# Patient Record
Sex: Female | Born: 1970 | Race: Black or African American | Hispanic: No | Marital: Married | Smoking: Never smoker
Health system: Southern US, Community
[De-identification: ages and names within clinical notes are randomized; demographics above are authoritative.]

## PROBLEM LIST (undated history)

## (undated) DIAGNOSIS — F419 Anxiety disorder, unspecified: Secondary | ICD-10-CM

## (undated) DIAGNOSIS — I10 Essential (primary) hypertension: Secondary | ICD-10-CM

## (undated) DIAGNOSIS — E119 Type 2 diabetes mellitus without complications: Secondary | ICD-10-CM

## (undated) DIAGNOSIS — F32A Depression, unspecified: Secondary | ICD-10-CM

## (undated) DIAGNOSIS — G4730 Sleep apnea, unspecified: Secondary | ICD-10-CM

## (undated) HISTORY — PX: EYE SURGERY: SHX253

## (undated) HISTORY — PX: TUBAL LIGATION: SHX77

## (undated) HISTORY — DX: Sleep apnea, unspecified: G47.30

## (undated) HISTORY — PX: HERNIA REPAIR: SHX51

---

## 2005-12-30 ENCOUNTER — Emergency Department: Payer: Self-pay | Admitting: Unknown Physician Specialty

## 2006-12-17 ENCOUNTER — Ambulatory Visit: Payer: Self-pay | Admitting: *Deleted

## 2008-01-25 ENCOUNTER — Emergency Department: Payer: Self-pay | Admitting: Emergency Medicine

## 2008-11-02 ENCOUNTER — Emergency Department: Payer: Self-pay | Admitting: Emergency Medicine

## 2010-03-08 ENCOUNTER — Emergency Department: Payer: Self-pay | Admitting: Internal Medicine

## 2011-10-24 ENCOUNTER — Emergency Department: Payer: Self-pay | Admitting: *Deleted

## 2011-10-24 LAB — COMPREHENSIVE METABOLIC PANEL
Albumin: 3.7 g/dL (ref 3.4–5.0)
Anion Gap: 3 — ABNORMAL LOW (ref 7–16)
BUN: 16 mg/dL (ref 7–18)
Bilirubin,Total: 0.3 mg/dL (ref 0.2–1.0)
Chloride: 105 mmol/L (ref 98–107)
EGFR (African American): >60
Osmolality: 284 (ref 275–301)
Potassium: 3.8 mmol/L (ref 3.5–5.1)
SGOT(AST): 9 U/L — ABNORMAL LOW (ref 15–37)
Sodium: 138 mmol/L (ref 136–145)
Total Protein: 7.3 g/dL (ref 6.4–8.2)

## 2011-10-24 LAB — CBC
HCT: 36.9 % (ref 35.0–47.0)
HGB: 12 g/dL (ref 12.0–16.0)
MCH: 25.9 pg — ABNORMAL LOW (ref 26.0–34.0)
MCHC: 32.6 g/dL (ref 32.0–36.0)
MCV: 80 fL (ref 80–100)
Platelet: 282 10*3/uL (ref 150–440)
RBC: 4.64 10*6/uL (ref 3.80–5.20)
WBC: 8.4 10*3/uL (ref 3.6–11.0)

## 2011-10-24 LAB — URINALYSIS, COMPLETE
Bacteria: NONE SEEN
Blood: NEGATIVE
Glucose,UR: 50 mg/dL (ref 0–75)
Ketone: NEGATIVE
Nitrite: NEGATIVE
Ph: 5 (ref 4.5–8.0)
Protein: 100
Specific Gravity: 1.041 (ref 1.003–1.030)
Squamous Epithelial: 35

## 2011-10-24 LAB — LIPASE, BLOOD: Lipase: 109 U/L (ref 73–393)

## 2013-03-19 ENCOUNTER — Emergency Department: Payer: Self-pay | Admitting: Emergency Medicine

## 2013-03-19 LAB — CBC
HCT: 39.9 % (ref 35.0–47.0)
HGB: 13.2 g/dL (ref 12.0–16.0)
MCH: 26.2 pg (ref 26.0–34.0)
MCHC: 33.2 g/dL (ref 32.0–36.0)
MCV: 79 fL — ABNORMAL LOW (ref 80–100)
Platelet: 215 10*3/uL (ref 150–440)
RBC: 5.05 10*6/uL (ref 3.80–5.20)
RDW: 13.6 % (ref 11.5–14.5)
WBC: 10.4 10*3/uL (ref 3.6–11.0)

## 2013-03-19 LAB — URINALYSIS, COMPLETE
Bilirubin,UR: NEGATIVE
Glucose,UR: >=500 mg/dL (ref 0–75)
Ketone: NEGATIVE
Leukocyte Esterase: NEGATIVE
Nitrite: NEGATIVE
Ph: 5 (ref 4.5–8.0)
RBC,UR: 2 /HPF (ref 0–5)
SPECIFIC GRAVITY: 1.035 (ref 1.003–1.030)
Squamous Epithelial: 9
WBC UR: 4 /HPF (ref 0–5)

## 2013-03-19 LAB — WET PREP, GENITAL

## 2013-03-19 LAB — GC/CHLAMYDIA PROBE AMP

## 2013-03-19 LAB — HCG, QUANTITATIVE, PREGNANCY: Beta Hcg, Quant.: 2710 m[IU]/mL — ABNORMAL HIGH

## 2013-10-13 ENCOUNTER — Emergency Department: Payer: Self-pay | Admitting: Emergency Medicine

## 2014-03-30 ENCOUNTER — Emergency Department: Payer: Self-pay | Admitting: Emergency Medicine

## 2014-05-17 ENCOUNTER — Emergency Department: Payer: Self-pay | Admitting: Emergency Medicine

## 2014-05-17 LAB — COMPREHENSIVE METABOLIC PANEL
ALBUMIN: 3.8 g/dL
Alkaline Phosphatase: 80 U/L
Anion Gap: 6 — ABNORMAL LOW (ref 7–16)
BILIRUBIN TOTAL: 0.2 mg/dL — AB
BUN: 16 mg/dL
CREATININE: 0.69 mg/dL
Calcium, Total: 8.8 mg/dL — ABNORMAL LOW
Chloride: 98 mmol/L — ABNORMAL LOW
Co2: 30 mmol/L
EGFR (Non-African Amer.): >60
Glucose: 421 mg/dL — ABNORMAL HIGH
Potassium: 4.8 mmol/L
SGOT(AST): 15 U/L
SGPT (ALT): 11 U/L — ABNORMAL LOW
Sodium: 134 mmol/L — ABNORMAL LOW
TOTAL PROTEIN: 7.1 g/dL

## 2014-05-17 LAB — CBC
HCT: 38.1 % (ref 35.0–47.0)
HGB: 12.1 g/dL (ref 12.0–16.0)
MCH: 25 pg — AB (ref 26.0–34.0)
MCHC: 31.8 g/dL — AB (ref 32.0–36.0)
MCV: 78 fL — ABNORMAL LOW (ref 80–100)
Platelet: 244 10*3/uL (ref 150–440)
RBC: 4.86 10*6/uL (ref 3.80–5.20)
RDW: 13.3 % (ref 11.5–14.5)
WBC: 8.5 10*3/uL (ref 3.6–11.0)

## 2014-05-17 LAB — URINALYSIS, COMPLETE
Bilirubin,UR: NEGATIVE
Glucose,UR: >=500 mg/dL (ref 0–75)
Ketone: NEGATIVE
NITRITE: NEGATIVE
Ph: 5 (ref 4.5–8.0)
Protein: NEGATIVE
RBC,UR: 3 /HPF (ref 0–5)
SPECIFIC GRAVITY: 1.024 (ref 1.003–1.030)
Squamous Epithelial: 7
WBC UR: 2 /HPF (ref 0–5)

## 2015-03-21 ENCOUNTER — Encounter: Payer: Self-pay | Admitting: Urgent Care

## 2015-03-21 ENCOUNTER — Emergency Department: Payer: BLUE CROSS/BLUE SHIELD

## 2015-03-21 DIAGNOSIS — Z79899 Other long term (current) drug therapy: Secondary | ICD-10-CM | POA: Insufficient documentation

## 2015-03-21 DIAGNOSIS — R202 Paresthesia of skin: Secondary | ICD-10-CM | POA: Insufficient documentation

## 2015-03-21 DIAGNOSIS — R51 Headache: Secondary | ICD-10-CM | POA: Diagnosis not present

## 2015-03-21 DIAGNOSIS — R200 Anesthesia of skin: Secondary | ICD-10-CM | POA: Diagnosis present

## 2015-03-21 DIAGNOSIS — E119 Type 2 diabetes mellitus without complications: Secondary | ICD-10-CM | POA: Diagnosis not present

## 2015-03-21 DIAGNOSIS — I10 Essential (primary) hypertension: Secondary | ICD-10-CM | POA: Insufficient documentation

## 2015-03-21 DIAGNOSIS — Z3202 Encounter for pregnancy test, result negative: Secondary | ICD-10-CM | POA: Diagnosis not present

## 2015-03-21 DIAGNOSIS — R110 Nausea: Secondary | ICD-10-CM | POA: Diagnosis not present

## 2015-03-21 DIAGNOSIS — K429 Umbilical hernia without obstruction or gangrene: Secondary | ICD-10-CM | POA: Insufficient documentation

## 2015-03-21 LAB — CBC
HCT: 36.5 % (ref 35.0–47.0)
HEMOGLOBIN: 11.8 g/dL — AB (ref 12.0–16.0)
MCH: 24.5 pg — ABNORMAL LOW (ref 26.0–34.0)
MCHC: 32.3 g/dL (ref 32.0–36.0)
MCV: 75.9 fL — ABNORMAL LOW (ref 80.0–100.0)
Platelets: 302 10*3/uL (ref 150–440)
RBC: 4.8 MIL/uL (ref 3.80–5.20)
RDW: 14 % (ref 11.5–14.5)
WBC: 8.5 10*3/uL (ref 3.6–11.0)

## 2015-03-21 LAB — BASIC METABOLIC PANEL
ANION GAP: 9 (ref 5–15)
BUN: 18 mg/dL (ref 6–20)
CALCIUM: 9.2 mg/dL (ref 8.9–10.3)
CO2: 27 mmol/L (ref 22–32)
Chloride: 97 mmol/L — ABNORMAL LOW (ref 101–111)
Creatinine, Ser: 0.59 mg/dL (ref 0.44–1.00)
GFR calc non Af Amer: >60 mL/min (ref >60)
Glucose, Bld: 367 mg/dL — ABNORMAL HIGH (ref 65–99)
POTASSIUM: 4 mmol/L (ref 3.5–5.1)
Sodium: 133 mmol/L — ABNORMAL LOW (ref 135–145)

## 2015-03-21 LAB — TROPONIN I: TROPONIN I: 0.03 ng/mL (ref <0.031)

## 2015-03-21 NOTE — ED Notes (Signed)
Schaevitz,MD consulted. MD made aware of presenting complaints and triage assessment. MD with [facility] for cardiac protocols and head CT. Orders to be entered and carried by this RN.

## 2015-03-21 NOTE — ED Notes (Signed)
Patient presents to the ED with multiple c/o. Patient expressing that she has numbness and tingling to the LUE and LEFT side of her face that began today at noon. Of note, patient had similar symptoms last week. Patient grossly NI with no facial droop; no pronator drift. Also, c/o a known umbilical hernia that has become increasingly painful over the last few days.

## 2015-03-22 ENCOUNTER — Emergency Department: Payer: BLUE CROSS/BLUE SHIELD

## 2015-03-22 ENCOUNTER — Emergency Department
Admission: EM | Admit: 2015-03-22 | Discharge: 2015-03-22 | Disposition: A | Discharge status: Home / Self Care | Payer: BLUE CROSS/BLUE SHIELD | Attending: Emergency Medicine | Admitting: Emergency Medicine

## 2015-03-22 DIAGNOSIS — R1033 Periumbilical pain: Secondary | ICD-10-CM

## 2015-03-22 DIAGNOSIS — K429 Umbilical hernia without obstruction or gangrene: Secondary | ICD-10-CM

## 2015-03-22 DIAGNOSIS — R202 Paresthesia of skin: Secondary | ICD-10-CM

## 2015-03-22 DIAGNOSIS — R51 Headache: Secondary | ICD-10-CM

## 2015-03-22 DIAGNOSIS — R519 Headache, unspecified: Secondary | ICD-10-CM

## 2015-03-22 HISTORY — DX: Essential (primary) hypertension: I10

## 2015-03-22 HISTORY — DX: Type 2 diabetes mellitus without complications: E11.9

## 2015-03-22 LAB — URINALYSIS COMPLETE WITH MICROSCOPIC (ARMC ONLY)
BILIRUBIN URINE: NEGATIVE
Bacteria, UA: NONE SEEN
KETONES UR: NEGATIVE mg/dL
Nitrite: NEGATIVE
PH: 5 (ref 5.0–8.0)
Protein, ur: 100 mg/dL — AB
Specific Gravity, Urine: 1.037 — ABNORMAL HIGH (ref 1.005–1.030)

## 2015-03-22 LAB — GLUCOSE, CAPILLARY: GLUCOSE-CAPILLARY: 309 mg/dL — AB (ref 65–99)

## 2015-03-22 LAB — POCT PREGNANCY, URINE: Preg Test, Ur: NEGATIVE

## 2015-03-22 MED ORDER — IOHEXOL 240 MG/ML SOLN
25.0000 mL | Freq: Once | INTRAMUSCULAR | Status: AC | PRN
  Administered 2015-03-22: 25 mL via ORAL

## 2015-03-22 MED ORDER — IOHEXOL 350 MG/ML SOLN
125.0000 mL | Freq: Once | INTRAVENOUS | Status: AC | PRN
  Administered 2015-03-22: 125 mL via INTRAVENOUS

## 2015-03-22 MED ORDER — KETOROLAC TROMETHAMINE 30 MG/ML IJ SOLN
30.0000 mg | Freq: Once | INTRAMUSCULAR | Status: AC
  Administered 2015-03-22: 30 mg via INTRAVENOUS
  Filled 2015-03-22: qty 1

## 2015-03-22 MED ORDER — DIPHENHYDRAMINE HCL 50 MG/ML IJ SOLN
25.0000 mg | Freq: Once | INTRAMUSCULAR | Status: AC
  Administered 2015-03-22: 25 mg via INTRAVENOUS
  Filled 2015-03-22: qty 1

## 2015-03-22 MED ORDER — METOCLOPRAMIDE HCL 5 MG/ML IJ SOLN
10.0000 mg | Freq: Once | INTRAMUSCULAR | Status: AC
  Administered 2015-03-22: 10 mg via INTRAVENOUS
  Filled 2015-03-22: qty 2

## 2015-03-22 MED ORDER — SODIUM CHLORIDE 0.9 % IV BOLUS (SEPSIS)
1000.0000 mL | Freq: Once | INTRAVENOUS | Status: AC
  Administered 2015-03-22: 1000 mL via INTRAVENOUS

## 2015-03-22 NOTE — ED Notes (Signed)
Iv placed in right ac per pt preference.

## 2015-03-22 NOTE — Discharge Instructions (Signed)
Abdominal Pain, Adult Many things can cause abdominal pain. Usually, abdominal pain is not caused by a disease and will improve without treatment. It can often be observed and treated at home. Your health care provider will do a physical exam and possibly order blood tests and X-rays to help determine the seriousness of your pain. However, in many cases, more time must pass before a clear cause of the pain can be found. Before that point, your health care provider may not know if you need more testing or further treatment. HOME CARE INSTRUCTIONS Monitor your abdominal pain for any changes. The following actions may help to alleviate any discomfort you are experiencing:  Only take over-the-counter or prescription medicines as directed by your health care provider.  Do not take laxatives unless directed to do so by your health care provider.  Try a clear liquid diet (broth, tea, or water) as directed by your health care provider. Slowly move to a bland diet as tolerated. SEEK MEDICAL CARE IF:  You have unexplained abdominal pain.  You have abdominal pain associated with nausea or diarrhea.  You have pain when you urinate or have a bowel movement.  You experience abdominal pain that wakes you in the night.  You have abdominal pain that is worsened or improved by eating food.  You have abdominal pain that is worsened with eating fatty foods.  You have a fever. SEEK IMMEDIATE MEDICAL CARE IF:  Your pain does not go away within 2 hours.  You keep throwing up (vomiting).  Your pain is felt only in portions of the abdomen, such as the right side or the left lower portion of the abdomen.  You pass bloody or black tarry stools. MAKE SURE YOU:  Understand these instructions.  Will watch your condition.  Will get help right away if you are not doing well or get worse.   This information is not intended to replace advice given to you by your health care provider. Make sure you discuss  any questions you have with your health care provider.   Document Released: 11/14/2004 Document Revised: 10/26/2014 Document Reviewed: 10/14/2012 Elsevier Interactive Patient Education 2016 [place] Headache Without Cause A headache is pain or discomfort felt around the head or neck area. The specific cause of a headache may not be found. There are many causes and types of headaches. A few common ones are:  Tension headaches.  Migraine headaches.  Cluster headaches.  Chronic daily headaches. HOME CARE INSTRUCTIONS  Watch your condition for any changes. Take these steps to help with your condition: Managing Pain  Take over-the-counter and prescription medicines only as told by your health care provider.  Lie down in a dark, quiet room when you have a headache.  If directed, apply ice to the head and neck area:  Put ice in a plastic bag.  Place a towel between your skin and the bag.  Leave the ice on for 20 minutes, 2-3 times per day.  Use a heating pad or hot shower to apply heat to the head and neck area as told by your health care provider.  Keep lights dim if bright lights bother you or make your headaches worse. Eating and Drinking  Eat meals on a regular schedule.  Limit alcohol use.  Decrease the amount of caffeine you drink, or stop drinking caffeine. General Instructions  Keep all follow-up visits as told by your health care provider. This is important.  Keep a headache journal to help  find out what may trigger your headaches. For example, write down:  What you eat and drink.  How much sleep you get.  Any change to your diet or medicines.  Try massage or other relaxation techniques.  Limit stress.  Sit up straight, and do not tense your muscles.  Do not use tobacco products, including cigarettes, chewing tobacco, or e-cigarettes. If you need help quitting, ask your health care provider.  Exercise regularly as told by your health care  provider.  Sleep on a regular schedule. Get 7-9 hours of sleep, or the amount recommended by your health care provider. SEEK MEDICAL CARE IF:   Your symptoms are not helped by medicine.  You have a headache that is different from the usual headache.  You have nausea or you vomit.  You have a fever. SEEK IMMEDIATE MEDICAL CARE IF:   Your headache becomes severe.  You have repeated vomiting.  You have a stiff neck.  You have a loss of vision.  You have problems with speech.  You have pain in the eye or ear.  You have muscular weakness or loss of muscle control.  You lose your balance or have trouble walking.  You feel faint or pass out.  You have confusion.   This information is not intended to replace advice given to you by your health care provider. Make sure you discuss any questions you have with your health care provider.   Document Released: 02/04/2005 Document Revised: 10/26/2014 Document Reviewed: 05/30/2014 Elsevier Interactive Patient Education 2016 [employer]  Paresthesia Paresthesia is an abnormal burning or prickling sensation. This sensation is generally felt in the hands, arms, legs, or feet. However, it may occur in any part of the body. Usually, it is not painful. The feeling may be described as:  Tingling or numbness.  Pins and needles.  Skin crawling.  Buzzing.  Limbs falling asleep.  Itching. Most people experience temporary (transient) paresthesia at some time in their lives. Paresthesia may occur when you breathe too quickly (hyperventilation). It can also occur without any apparent cause. Commonly, paresthesia occurs when pressure is placed on a nerve. The sensation quickly goes away after the pressure is removed. For some people, however, paresthesia is a long-lasting (chronic) condition that is caused by an underlying disorder. If you continue to have paresthesia, you may need further medical evaluation. HOME CARE INSTRUCTIONS Watch  your condition for any changes. Taking the following actions may help to lessen any discomfort that you are feeling:  Avoid drinking alcohol.  Try acupuncture or massage to help relieve your symptoms.  Keep all follow-up visits as directed by your health care provider. This is important. SEEK MEDICAL CARE IF:  You continue to have episodes of paresthesia.  Your burning or prickling feeling gets worse when you walk.  You have pain, cramps, or dizziness.  You develop a rash. SEEK IMMEDIATE MEDICAL CARE IF:  You feel weak.  You have trouble walking or moving.  You have problems with speech, understanding, or vision.  You feel confused.  You cannot control your bladder or bowel movements.  You have numbness after an injury.  You faint.   This information is not intended to replace advice given to you by your health care provider. Make sure you discuss any questions you have with your health care provider.   Document Released: 01/25/2002 Document Revised: 06/21/2014 Document Reviewed: 01/31/2014 Elsevier Interactive Patient Education [employer].

## 2015-03-22 NOTE — ED Notes (Addendum)
Pt resting. Family with pt

## 2015-03-22 NOTE — ED Notes (Signed)
Pt waiting on ct scan.  Iv fluids infusing.  Pt sleepy.  Easily aroused.

## 2015-03-22 NOTE — ED Provider Notes (Signed)
[facility] Emergency Department Provider Note  ____________________________________________  Time seen: Approximately 1215 AM  I have reviewed the triage vital signs and the nursing notes.   HISTORY  Chief Complaint Hernia and Numbness    HPI Kathryn Hogan is a 45 y.o. female who comes into the hospital today with multiple complaints.The patient is reporting some numbness in her face and arms. She reports that the symptoms initially started last week but went away. The patient was not seen when this happened at that time. The patient reports that the numbness is under her left arm and top of her left face. She reports that the symptoms came back today around noon. Patient was cleaning when the symptoms started. She reports that the sensation fades in and seems to come back but her arm is been consistent. The patient denies any weakness. She has had some headache nausea and blurred vision with the headache she rates a 7 out of 10 in intensity. The patient did not take anything for her headache but reports this started after the numbness started. She reports also that she has an umbilical hernia and has had some sharp pain on and off all week but today the pain seems to be bothering her more than normal. The patient rates her abdominal pain and 9 out of 10 in intensity. The patient has had some constipation.   Past Medical History  Diagnosis Date  . Diabetes mellitus without complication ([place])   . Hypertension     There are no active problems to display for this patient.   History reviewed. No pertinent past surgical history.  Current Outpatient Rx  Name  Route  Sig  Dispense  Refill  . BENAZEPRIL HCL PO   Oral   Take 1 tablet by mouth daily.         . hydrochlorothiazide (HYDRODIURIL) 25 MG tablet   Oral   Take 25 mg by mouth daily.         . metFORMIN (GLUCOPHAGE) 500 MG tablet   Oral   Take 500 mg by mouth 2 (two) times daily with a meal.            Allergies Review of patient's allergies indicates no known allergies.  No family history on file.  Social History Social History  Substance Use Topics  . Smoking status: Never Smoker   . Smokeless tobacco: None  . Alcohol Use: No    Review of Systems Constitutional: No fever/chills Eyes: No visual changes. ENT: No sore throat. Cardiovascular: Denies chest pain. Respiratory: Denies shortness of breath. Gastrointestinal: abdominal pain and nausea, no vomiting.  No diarrhea.  No constipation. Genitourinary: Negative for dysuria. Musculoskeletal: Negative for back pain. Skin: Negative for rash. Neurological: Arm and facial numbness  10-point ROS otherwise negative.  ____________________________________________   PHYSICAL EXAM:  VITAL SIGNS: ED Triage Vitals  Enc Vitals Group     BP 03/21/15 1941 198/108 mmHg     Pulse Rate 03/21/15 1941 110     Resp 03/21/15 1941 18     Temp 03/21/15 1941 98.4 F (36.9 C)     Temp Source 03/21/15 1941 Oral     SpO2 03/21/15 1941 98 %     Weight 03/21/15 1941 273 lb (123.832 kg)     Height 03/21/15 1941 5\' 1"  (1.549 m)     Head Cir --      Peak Flow --      Pain Score 03/21/15 1942 8  Pain Loc --      Pain Edu? --      Excl. in [place]? --     Constitutional: Alert and oriented. Well appearing and in mild to moderate distress. Eyes: Conjunctivae are normal. PERRL. EOMI. Head: Atraumatic. Nose: No congestion/rhinnorhea. Mouth/Throat: Mucous membranes are moist.  Oropharynx non-erythematous. Cardiovascular: Normal rate, regular rhythm. Grossly normal heart sounds.  Good peripheral circulation. Respiratory: Normal respiratory effort.  No retractions. Lungs CTAB. Gastrointestinal: Soft and some mild tenderness to pain at her umbilicus and a palpable hernia that I am unable to reduce at this time No distention. Positive bowel sounds Musculoskeletal: No lower extremity tenderness nor edema.   Neurologic:  Normal speech and  language. Some numbness to left lower arm. But otherwise cranial nerves II through XII are grossly intact Skin:  Skin is warm, dry and intact.  Psychiatric: Mood and affect are normal.   ____________________________________________   LABS (all labs ordered are listed, but only abnormal results are displayed)  Labs Reviewed  BASIC METABOLIC PANEL - Abnormal; Notable for the following:    Sodium 133 (*)    Chloride 97 (*)    Glucose, Bld 367 (*)    All other components within normal limits  CBC - Abnormal; Notable for the following:    Hemoglobin 11.8 (*)    MCV 75.9 (*)    MCH 24.5 (*)    All other components within normal limits  URINALYSIS COMPLETEWITH MICROSCOPIC ([facility] ONLY) - Abnormal; Notable for the following:    Color, Urine RED (*)    APPearance CLOUDY (*)    Glucose, UA >500 (*)    Specific Gravity, Urine 1.037 (*)    Hgb urine dipstick 3+ (*)    Protein, ur 100 (*)    Leukocytes, UA TRACE (*)    Squamous Epithelial / LPF 6-30 (*)    All other components within normal limits  GLUCOSE, CAPILLARY - Abnormal; Notable for the following:    Glucose-Capillary 309 (*)    All other components within normal limits  TROPONIN I  POCT PREGNANCY, URINE   ____________________________________________  EKG  ED ECG REPORT I, Loney Hering, the attending physician, personally viewed and interpreted this ECG.   Date: 03/22/2015  EKG Time: 2012  Rate: 106  Rhythm: sinus tachycardia  Axis: None  Intervals:none  ST&T Change: None  ____________________________________________  RADIOLOGY  CT head: Normal CT head  CT abdomen and pelvis: Uncomplicated fatty umbilical hernia, no acute finding, 24 mm left perirenal mass with internal fat and other low density his follow-up ____________________________________________   PROCEDURES  Procedure(s) performed: None  Critical Care performed: No  ____________________________________________   INITIAL IMPRESSION /  ASSESSMENT AND PLAN / ED COURSE  Pertinent labs & imaging results that were available during my care of the patient were reviewed by me and considered in my medical decision making (see chart for details).  This is a 45 year old female who comes into the hospital today with some facial and arm numbness as well as some abdominal pain. The patient's head CT is unremarkable. The patient does have a headaches I will give her some Reglan, Benadryl, Toradol and normal saline. I will also do a CT scan to evaluate the patient's hernia.  The patient's hernia is uncomplicated and has some fat in the hernia. The patient's headache is improved after the medication. The patient needs follow-up with her primary care physician as well as surgery and neurology for her paresthesias. The patient did not have  any numbness to her face when examined. The patient be discharged home to follow-up with her primary care physician, surgery and neurology. ____________________________________________   FINAL CLINICAL IMPRESSION(S) / ED DIAGNOSES  Final diagnoses:  Acute nonintractable headache, unspecified headache type  Paresthesia  Recurrent umbilical hernia  Periumbilical abdominal pain      Loney Hering, MD 03/22/15 [phone]

## 2015-04-12 ENCOUNTER — Other Ambulatory Visit: Payer: Self-pay | Admitting: Family Medicine

## 2015-04-12 DIAGNOSIS — Z1239 Encounter for other screening for malignant neoplasm of breast: Secondary | ICD-10-CM

## 2015-05-05 ENCOUNTER — Ambulatory Visit
Admission: RE | Admit: 2015-05-05 | Discharge: 2015-05-05 | Disposition: A | Discharge status: Home / Self Care | Payer: BLUE CROSS/BLUE SHIELD | Source: Ambulatory Visit | Attending: Family Medicine | Admitting: Family Medicine

## 2015-05-05 DIAGNOSIS — Z1239 Encounter for other screening for malignant neoplasm of breast: Secondary | ICD-10-CM

## 2015-05-05 DIAGNOSIS — Z1231 Encounter for screening mammogram for malignant neoplasm of breast: Secondary | ICD-10-CM | POA: Insufficient documentation

## 2015-07-07 ENCOUNTER — Encounter: Payer: Self-pay | Admitting: *Deleted

## 2015-07-24 ENCOUNTER — Encounter: Payer: Self-pay | Admitting: *Deleted

## 2015-07-25 ENCOUNTER — Ambulatory Visit (INDEPENDENT_AMBULATORY_CARE_PROVIDER_SITE_OTHER): Payer: BLUE CROSS/BLUE SHIELD | Admitting: General Surgery

## 2015-07-25 ENCOUNTER — Encounter: Payer: Self-pay | Admitting: General Surgery

## 2015-07-25 VITALS — BP 130/76 | HR 76 | Resp 12 | Ht 60.0 in | Wt 263.0 lb

## 2015-07-25 DIAGNOSIS — K429 Umbilical hernia without obstruction or gangrene: Secondary | ICD-10-CM

## 2015-07-25 NOTE — Patient Instructions (Addendum)
Hernia, Adult A hernia is the bulging of an organ or tissue through a weak spot in the muscles of the abdomen (abdominal wall). Hernias develop most often near the navel or groin. There are many kinds of hernias. Common kinds include:  Femoral hernia. This kind of hernia develops under the groin in the upper thigh area.  Inguinal hernia. This kind of hernia develops in the groin or scrotum.  Umbilical hernia. This kind of hernia develops near the navel.  Hiatal hernia. This kind of hernia causes part of the stomach to be pushed up into the chest.  Incisional hernia. This kind of hernia bulges through a scar from an abdominal surgery. CAUSES This condition may be caused by:  Heavy lifting.  Coughing over a long period of time.  Straining to have a bowel movement.  An incision made during an abdominal surgery.  A birth defect (congenital defect).  Excess weight or obesity.  Smoking.  Poor nutrition.  Cystic fibrosis.  Excess fluid in the abdomen.  Undescended testicles. SYMPTOMS Symptoms of a hernia include:  A lump on the abdomen. This is the first sign of a hernia. The lump may become more obvious with standing, straining, or coughing. It may get bigger over time if it is not treated or if the condition causing it is not treated.  Pain. A hernia is usually painless, but it may become painful over time if treatment is delayed. The pain is usually dull and may get worse with standing or lifting heavy objects. Sometimes a hernia gets tightly squeezed in the weak spot (strangulated) or stuck there (incarcerated) and causes additional symptoms. These symptoms may include:  Vomiting.  Nausea.  Constipation.  Irritability. DIAGNOSIS A hernia may be diagnosed with:  A physical exam. During the exam your health care provider may ask you to cough or to make a specific movement, because a hernia is usually more visible when you move.  Imaging tests. These can  include:  X-rays.  Ultrasound.  CT scan. TREATMENT A hernia that is small and painless may not need to be treated. A hernia that is large or painful may be treated with surgery. Inguinal hernias may be treated with surgery to prevent incarceration or strangulation. Strangulated hernias are always treated with surgery, because lack of blood to the trapped organ or tissue can cause it to die. Surgery to treat a hernia involves pushing the bulge back into place and repairing the weak part of the abdomen. HOME CARE INSTRUCTIONS  Avoid straining.  Do not lift anything heavier than 10 lb (4.5 kg).  Lift with your leg muscles, not your back muscles. This helps avoid strain.  When coughing, try to cough gently.  Prevent constipation. Constipation leads to straining with bowel movements, which can make a hernia worse or cause a hernia repair to break down. You can prevent constipation by:  Eating a high-fiber diet that includes plenty of fruits and vegetables.  Drinking enough fluids to keep your urine clear or pale yellow. Aim to drink 6-8 glasses of water per day.  Using a stool softener as directed by your health care provider.  Lose weight, if you are overweight.  Do not use any tobacco products, including cigarettes, chewing tobacco, or electronic cigarettes. If you need help quitting, ask your health care provider.  Keep all follow-up visits as directed by your health care provider. This is important. Your health care provider may need to monitor your condition. SEEK MEDICAL CARE IF:  You have  swelling, redness, and pain in the affected area.  Your bowel habits change. SEEK IMMEDIATE MEDICAL CARE IF:  You have a fever.  You have abdominal pain that is getting worse.  You feel nauseous or you vomit.  You cannot push the hernia back in place by gently pressing on it while you are lying down.  The hernia:  Changes in shape or size.  Is stuck outside the  abdomen.  Becomes discolored.  Feels hard or tender.   This information is not intended to replace advice given to you by your health care provider. Make sure you discuss any questions you have with your health care provider.   Document Released: 02/04/2005 Document Revised: 02/25/2014 Document Reviewed: 12/15/2013 Elsevier Interactive Patient Education [employer].  Patient is scheduled for surgery at [facility] on 08/02/15. She will pre admit by phone. Patient is aware of date and instructions.

## 2015-07-25 NOTE — Progress Notes (Signed)
Patient ID: Kathryn Hogan, female   DOB: 06/13/1970, 45 y.o.   MRN: [id number]  Chief Complaint  Patient presents with  . Other    umbilical hernia    HPI Kathryn Hogan is a 45 y.o. female here today from a evaluation of a umbilical hernia. Patient states she noticed this about 15 years ago. The area has got bigger over the last couple of years. She states over the last month when she is doing physical activity the area is painful. I have reviewed the history of present illness with the patient.   HPI  Past Medical History  Diagnosis Date  . Diabetes mellitus without complication ([place])   . Hypertension   . Sleep apnea     does not use CPAP    Past Surgical History  Procedure Laterality Date  . Tubal ligation      Family History  Problem Relation Age of Onset  . Breast cancer Sister 75  . Esophageal cancer Mother     Social History Social History  Substance Use Topics  . Smoking status: Never Smoker   . Smokeless tobacco: None  . Alcohol Use: No    No Known Allergies  Current Outpatient Prescriptions  Medication Sig Dispense Refill  . BENAZEPRIL HCL PO Take 1 tablet by mouth daily.    . hydrochlorothiazide (HYDRODIURIL) 25 MG tablet Take 25 mg by mouth daily.    . metFORMIN (GLUCOPHAGE) 500 MG tablet Take 500 mg by mouth 2 (two) times daily with a meal.     No current facility-administered medications for this visit.    Review of Systems Review of Systems  Constitutional: Negative.   Respiratory: Negative.   Cardiovascular: Negative.     Blood pressure 130/76, pulse 76, resp. rate 12, height 5' (1.524 m), weight 263 lb (119.296 kg), last menstrual period 07/18/2015.  Physical Exam Physical Exam  Constitutional: She is oriented to person, place, and time. She appears well-developed and well-nourished.  Eyes: Conjunctivae are normal. No scleral icterus.  Neck: Neck supple.  Cardiovascular: Normal rate, regular rhythm and normal heart sounds.    Pulmonary/Chest: Effort normal and breath sounds normal.  Abdominal: Soft. Normal appearance and bowel sounds are normal. There is no tenderness. A hernia (4 cm umbilical hernia) is present.  Lymphadenopathy:    She has no cervical adenopathy.  Neurological: She is alert and oriented to person, place, and time.  Skin: Skin is warm and dry.    Data Reviewed Notes reviewed   Assessment    Umbilical hernia, symptomatic    Plan    Hernia precautions and incarceration were discussed with the patient. If they develop symptoms of an incarcerated hernia, they were encouraged to seek prompt medical attention.  I have recommended repair of the hernia using mesh on an outpatient basis in the near future. The risk of infection was reviewed. The role of prosthetic mesh to minimize the risk of recurrence was reviewed.     Patient is scheduled for surgery at [facility] on 08/02/15. She will pre admit by phone. Patient is aware of date and instructions.   PCP:  [facility] This information has been scribed by Kathryn Hogan CMA.    SANKAR,SEEPLAPUTHUR G 07/27/2015, 8:53 AM

## 2015-07-27 ENCOUNTER — Encounter: Payer: Self-pay | Admitting: General Surgery

## 2015-07-31 ENCOUNTER — Inpatient Hospital Stay: Admission: RE | Admit: 2015-07-31 | Payer: BLUE CROSS/BLUE SHIELD | Source: Ambulatory Visit

## 2015-07-31 ENCOUNTER — Encounter
Admission: RE | Admit: 2015-07-31 | Discharge: 2015-07-31 | Disposition: A | Discharge status: Home / Self Care | Payer: BLUE CROSS/BLUE SHIELD | Source: Ambulatory Visit | Attending: General Surgery | Admitting: General Surgery

## 2015-07-31 DIAGNOSIS — Z803 Family history of malignant neoplasm of breast: Secondary | ICD-10-CM | POA: Diagnosis not present

## 2015-07-31 DIAGNOSIS — K429 Umbilical hernia without obstruction or gangrene: Secondary | ICD-10-CM | POA: Diagnosis not present

## 2015-07-31 DIAGNOSIS — Z7984 Long term (current) use of oral hypoglycemic drugs: Secondary | ICD-10-CM | POA: Diagnosis not present

## 2015-07-31 DIAGNOSIS — Z79899 Other long term (current) drug therapy: Secondary | ICD-10-CM | POA: Diagnosis not present

## 2015-07-31 DIAGNOSIS — I10 Essential (primary) hypertension: Secondary | ICD-10-CM | POA: Diagnosis not present

## 2015-07-31 DIAGNOSIS — K420 Umbilical hernia with obstruction, without gangrene: Secondary | ICD-10-CM | POA: Diagnosis present

## 2015-07-31 DIAGNOSIS — G4730 Sleep apnea, unspecified: Secondary | ICD-10-CM | POA: Diagnosis not present

## 2015-07-31 DIAGNOSIS — E119 Type 2 diabetes mellitus without complications: Secondary | ICD-10-CM | POA: Diagnosis not present

## 2015-07-31 DIAGNOSIS — Z800 Family history of malignant neoplasm of digestive organs: Secondary | ICD-10-CM | POA: Diagnosis not present

## 2015-07-31 LAB — CBC
HCT: 31.8 % — ABNORMAL LOW (ref 35.0–47.0)
Hemoglobin: 10.5 g/dL — ABNORMAL LOW (ref 12.0–16.0)
MCH: 24.9 pg — ABNORMAL LOW (ref 26.0–34.0)
MCHC: 33 g/dL (ref 32.0–36.0)
MCV: 75.4 fL — AB (ref 80.0–100.0)
PLATELETS: 298 10*3/uL (ref 150–440)
RBC: 4.21 MIL/uL (ref 3.80–5.20)
RDW: 13.9 % (ref 11.5–14.5)
WBC: 8 10*3/uL (ref 3.6–11.0)

## 2015-07-31 LAB — BASIC METABOLIC PANEL
Anion gap: 9 (ref 5–15)
BUN: 20 mg/dL (ref 6–20)
CALCIUM: 9.2 mg/dL (ref 8.9–10.3)
CHLORIDE: 102 mmol/L (ref 101–111)
CO2: 26 mmol/L (ref 22–32)
CREATININE: 0.66 mg/dL (ref 0.44–1.00)
GFR calc non Af Amer: >60 mL/min (ref >60)
Glucose, Bld: 199 mg/dL — ABNORMAL HIGH (ref 65–99)
Potassium: 3.7 mmol/L (ref 3.5–5.1)
SODIUM: 137 mmol/L (ref 135–145)

## 2015-07-31 NOTE — Patient Instructions (Signed)
Your procedure is scheduled on: 08/02/15 Report to Day Surgery. 2nd floor medical mal entrance To find out your arrival time please call [phone] between 1PM - 3PM on 08/01/15.  Remember: Instructions that are not followed completely may result in serious medical risk, up to and including death, or upon the discretion of your surgeon and anesthesiologist your surgery may need to be rescheduled.    __X__ 1. Do not eat food or drink liquids after midnight. No gum chewing or hard candies.     __X__ 2. No Alcohol for 24 hours before or after surgery.   ____ 3. Bring all medications with you on the day of surgery if instructed.    __X__ 4. Notify your doctor if there is any change in your medical condition     (cold, fever, infections).     Do not wear jewelry, make-up, hairpins, clips or nail polish.  Do not wear lotions, powders, or perfumes.   Do not shave 48 hours prior to surgery. Men may shave face and neck.  Do not bring valuables to the hospital.    [facility] is not responsible for any belongings or valuables.               Contacts, dentures or bridgework may not be worn into surgery.  Leave your suitcase in the car. After surgery it may be brought to your room.  For patients admitted to the hospital, discharge time is determined by your                treatment team.   Patients discharged the day of surgery will not be allowed to drive home.   Please read over the following fact sheets that you were given:   Surgical Site Infection Prevention   __x__ Take these medicines the morning of surgery with A SIP OF WATER:    1. amlodipine  2. benazepril  3.   4.  5.  6.  ____ Fleet Enema (as directed)   __x__ Use CHG Soap as directed  ____ Use inhalers on the day of surgery  __x__ Stop metformin 2 days prior to surgery    __x__ Take 1/2 of usual insulin dose the night before surgery and none on the morning of surgery.   ____ Stop Coumadin/Plavix/aspirin on   __x__  Stop Anti-inflammatories on today (ALEVE)   ____ Stop supplements until after surgery.    ____ Bring C-Pap to the hospital.

## 2015-08-01 DIAGNOSIS — K429 Umbilical hernia without obstruction or gangrene: Secondary | ICD-10-CM | POA: Diagnosis not present

## 2015-08-02 ENCOUNTER — Ambulatory Visit: Payer: BLUE CROSS/BLUE SHIELD | Admitting: Anesthesiology

## 2015-08-02 ENCOUNTER — Encounter: Payer: Self-pay | Admitting: Anesthesiology

## 2015-08-02 ENCOUNTER — Ambulatory Visit
Admission: RE | Admit: 2015-08-02 | Discharge: 2015-08-02 | Disposition: A | Discharge status: Home / Self Care | Payer: BLUE CROSS/BLUE SHIELD | Source: Ambulatory Visit | Attending: General Surgery | Admitting: General Surgery

## 2015-08-02 ENCOUNTER — Encounter
Admission: RE | Disposition: A | Discharge status: Home / Self Care | Payer: Self-pay | Source: Ambulatory Visit | Attending: General Surgery

## 2015-08-02 DIAGNOSIS — K429 Umbilical hernia without obstruction or gangrene: Secondary | ICD-10-CM

## 2015-08-02 DIAGNOSIS — I10 Essential (primary) hypertension: Secondary | ICD-10-CM | POA: Insufficient documentation

## 2015-08-02 DIAGNOSIS — Z803 Family history of malignant neoplasm of breast: Secondary | ICD-10-CM | POA: Insufficient documentation

## 2015-08-02 DIAGNOSIS — G4730 Sleep apnea, unspecified: Secondary | ICD-10-CM | POA: Insufficient documentation

## 2015-08-02 DIAGNOSIS — Z800 Family history of malignant neoplasm of digestive organs: Secondary | ICD-10-CM | POA: Insufficient documentation

## 2015-08-02 DIAGNOSIS — Z79899 Other long term (current) drug therapy: Secondary | ICD-10-CM | POA: Insufficient documentation

## 2015-08-02 DIAGNOSIS — E119 Type 2 diabetes mellitus without complications: Secondary | ICD-10-CM | POA: Insufficient documentation

## 2015-08-02 DIAGNOSIS — Z7984 Long term (current) use of oral hypoglycemic drugs: Secondary | ICD-10-CM | POA: Insufficient documentation

## 2015-08-02 DIAGNOSIS — K420 Umbilical hernia with obstruction, without gangrene: Secondary | ICD-10-CM | POA: Diagnosis not present

## 2015-08-02 HISTORY — PX: UMBILICAL HERNIA REPAIR: SHX196

## 2015-08-02 LAB — GLUCOSE, CAPILLARY
GLUCOSE-CAPILLARY: 251 mg/dL — AB (ref 65–99)
GLUCOSE-CAPILLARY: 249 mg/dL — AB (ref 65–99)
Glucose-Capillary: 240 mg/dL — ABNORMAL HIGH (ref 65–99)

## 2015-08-02 LAB — POCT PREGNANCY, URINE: PREG TEST UR: NEGATIVE

## 2015-08-02 SURGERY — REPAIR, HERNIA, UMBILICAL, ADULT
Anesthesia: General | Site: Abdomen | Wound class: Clean Contaminated

## 2015-08-02 MED ORDER — FENTANYL CITRATE (PF) 100 MCG/2ML IJ SOLN
INTRAMUSCULAR | Status: DC | PRN
  Administered 2015-08-02: 100 ug via INTRAVENOUS

## 2015-08-02 MED ORDER — CHLORHEXIDINE GLUCONATE CLOTH 2 % EX PADS: 6.0000 | MEDICATED_PAD | Freq: Once | CUTANEOUS | Status: DC

## 2015-08-02 MED ORDER — PROPOFOL 10 MG/ML IV BOLUS
INTRAVENOUS | Status: DC | PRN
  Administered 2015-08-02: 200 mg via INTRAVENOUS

## 2015-08-02 MED ORDER — ONDANSETRON HCL 4 MG/2ML IJ SOLN
INTRAMUSCULAR | Status: DC | PRN
  Administered 2015-08-02: 4 mg via INTRAVENOUS

## 2015-08-02 MED ORDER — OXYCODONE HCL 5 MG PO TABS
ORAL_TABLET | ORAL | Status: DC
Start: 2015-08-02 — End: 2015-08-02
  Filled 2015-08-02: qty 1

## 2015-08-02 MED ORDER — FENTANYL CITRATE (PF) 100 MCG/2ML IJ SOLN
INTRAMUSCULAR | Status: AC
  Filled 2015-08-02: qty 2

## 2015-08-02 MED ORDER — SODIUM CHLORIDE 0.9 % IV SOLN
INTRAVENOUS | Status: DC
  Administered 2015-08-02: 11:00:00 via INTRAVENOUS

## 2015-08-02 MED ORDER — MIDAZOLAM HCL 2 MG/2ML IJ SOLN
INTRAMUSCULAR | Status: DC | PRN
  Administered 2015-08-02: 1 mg via INTRAVENOUS

## 2015-08-02 MED ORDER — CEFAZOLIN SODIUM-DEXTROSE 2-4 GM/100ML-% IV SOLN
INTRAVENOUS | Status: AC
  Filled 2015-08-02: qty 100

## 2015-08-02 MED ORDER — OXYCODONE HCL 5 MG/5ML PO SOLN: 5.0000 mg | Freq: Once | ORAL | Status: AC | PRN

## 2015-08-02 MED ORDER — FENTANYL CITRATE (PF) 100 MCG/2ML IJ SOLN
25.0000 ug | INTRAMUSCULAR | Status: DC | PRN
  Administered 2015-08-02 (×3): 50 ug via INTRAVENOUS

## 2015-08-02 MED ORDER — BUPIVACAINE HCL (PF) 0.5 % IJ SOLN
INTRAMUSCULAR | Status: AC
  Filled 2015-08-02: qty 30

## 2015-08-02 MED ORDER — SUCCINYLCHOLINE CHLORIDE 20 MG/ML IJ SOLN
INTRAMUSCULAR | Status: DC | PRN
  Administered 2015-08-02: 100 mg via INTRAVENOUS

## 2015-08-02 MED ORDER — ROCURONIUM BROMIDE 100 MG/10ML IV SOLN
INTRAVENOUS | Status: DC | PRN
  Administered 2015-08-02: 25 mg via INTRAVENOUS

## 2015-08-02 MED ORDER — INSULIN ASPART 100 UNIT/ML ~~LOC~~ SOLN
5.0000 [IU] | Freq: Once | SUBCUTANEOUS | Status: AC
  Administered 2015-08-02: 5 [IU] via SUBCUTANEOUS

## 2015-08-02 MED ORDER — ACETAMINOPHEN 10 MG/ML IV SOLN
INTRAVENOUS | Status: AC
  Filled 2015-08-02: qty 100

## 2015-08-02 MED ORDER — SUGAMMADEX SODIUM 500 MG/5ML IV SOLN
INTRAVENOUS | Status: DC | PRN
  Administered 2015-08-02: 240 mg via INTRAVENOUS

## 2015-08-02 MED ORDER — FAMOTIDINE 20 MG PO TABS
20.0000 mg | ORAL_TABLET | Freq: Once | ORAL | Status: AC
  Administered 2015-08-02: 20 mg via ORAL

## 2015-08-02 MED ORDER — KETOROLAC TROMETHAMINE 30 MG/ML IJ SOLN
INTRAMUSCULAR | Status: DC | PRN
  Administered 2015-08-02: 30 mg via INTRAVENOUS

## 2015-08-02 MED ORDER — CEFAZOLIN SODIUM-DEXTROSE 2-4 GM/100ML-% IV SOLN
2.0000 g | INTRAVENOUS | Status: AC
  Administered 2015-08-02: 2 g via INTRAVENOUS

## 2015-08-02 MED ORDER — LIDOCAINE HCL (PF) 1 % IJ SOLN
INTRAMUSCULAR | Status: AC
  Filled 2015-08-02: qty 30

## 2015-08-02 MED ORDER — BUPIVACAINE HCL 0.5 % IJ SOLN
INTRAMUSCULAR | Status: DC | PRN
  Administered 2015-08-02: 20 mL

## 2015-08-02 MED ORDER — DEXAMETHASONE SODIUM PHOSPHATE 10 MG/ML IJ SOLN
INTRAMUSCULAR | Status: DC | PRN
  Administered 2015-08-02: 5 mg via INTRAVENOUS

## 2015-08-02 MED ORDER — ACETAMINOPHEN 10 MG/ML IV SOLN
INTRAVENOUS | Status: DC | PRN
  Administered 2015-08-02: 1000 mg via INTRAVENOUS

## 2015-08-02 MED ORDER — FAMOTIDINE 20 MG PO TABS
ORAL_TABLET | ORAL | Status: AC
  Filled 2015-08-02: qty 1

## 2015-08-02 MED ORDER — OXYCODONE HCL 5 MG PO TABS
5.0000 mg | ORAL_TABLET | Freq: Once | ORAL | Status: AC | PRN
  Administered 2015-08-02: 5 mg via ORAL

## 2015-08-02 MED ORDER — INSULIN ASPART 100 UNIT/ML ~~LOC~~ SOLN
SUBCUTANEOUS | Status: AC
  Filled 2015-08-02: qty 5

## 2015-08-02 MED ORDER — OXYCODONE-ACETAMINOPHEN 5-325 MG PO TABS: 1.0000 | ORAL_TABLET | ORAL | Status: DC | PRN

## 2015-08-02 MED ORDER — EPHEDRINE SULFATE 50 MG/ML IJ SOLN
INTRAMUSCULAR | Status: DC | PRN
  Administered 2015-08-02: 10 mg via INTRAVENOUS

## 2015-08-02 SURGICAL SUPPLY — 28 items
BLADE SURG 15 STRL SS SAFETY (BLADE) ×3 IMPLANT
CANISTER SUCT 1200ML W/VALVE (MISCELLANEOUS) ×3 IMPLANT
CHLORAPREP W/TINT 26ML (MISCELLANEOUS) ×3 IMPLANT
DECANTER SPIKE VIAL GLASS SM (MISCELLANEOUS) IMPLANT
DRAPE LAPAROTOMY 100X77 ABD (DRAPES) ×3 IMPLANT
ELECT REM PT RETURN 9FT ADLT (ELECTROSURGICAL) ×3
ELECTRODE REM PT RTRN 9FT ADLT (ELECTROSURGICAL) ×1 IMPLANT
GLOVE BIO SURGEON STRL SZ7 (GLOVE) ×9 IMPLANT
GOWN STRL REUS W/ TWL LRG LVL3 (GOWN DISPOSABLE) ×3 IMPLANT
GOWN STRL REUS W/TWL LRG LVL3 (GOWN DISPOSABLE) ×6
KIT RM TURNOVER STRD PROC AR (KITS) ×3 IMPLANT
LABEL OR SOLS (LABEL) IMPLANT
LIQUID BAND (GAUZE/BANDAGES/DRESSINGS) ×3 IMPLANT
MESH VENTRALEX ST 2.5 CRC MED (Mesh General) ×3 IMPLANT
NEEDLE HYPO 25X1 1.5 SAFETY (NEEDLE) ×3 IMPLANT
NS IRRIG 500ML POUR BTL (IV SOLUTION) ×3 IMPLANT
PACK BASIN MINOR ARMC (MISCELLANEOUS) ×3 IMPLANT
SPONGE KITTNER 5P (MISCELLANEOUS) ×3 IMPLANT
SUT PROLENE 0 CT 2 (SUTURE) ×9 IMPLANT
SUT VIC AB 2-0 SH 27 (SUTURE) ×2
SUT VIC AB 2-0 SH 27XBRD (SUTURE) ×1 IMPLANT
SUT VIC AB 3-0 SH 27 (SUTURE) ×2
SUT VIC AB 3-0 SH 27X BRD (SUTURE) ×1 IMPLANT
SUT VIC AB 4-0 FS2 27 (SUTURE) ×6 IMPLANT
SUT VICRYL 2 0 18  UND BR (SUTURE) ×2
SUT VICRYL 2 0 18 UND BR (SUTURE) ×1 IMPLANT
SUT VICRYL+ 3-0 144IN (SUTURE) ×3 IMPLANT
SYR CONTROL 10ML (SYRINGE) ×3 IMPLANT

## 2015-08-02 NOTE — Op Note (Signed)
Preop diagnosis: Incarcerated umbilical hernia  Post op diagnosis: Same  Operation: Repair of incarcerated umbilical hernia with the 6 cm Ventralex ST patch, and partial omentectomy  Surgeon: Mckinley Jewel  Assistant:     Anesthesia: Gen.  Complications: None  EBL: Approximately 5 mL  Drains: None  Description: Patient was put to sleep in supine position the operating table the umbilical area was prepped and draped as sterile field and timeout performed. Patient had a 4 cm hernia protrusion visible and palpable along the right lip of the umbilicus. Skin incision was made along the right lip and extended up and below the umbilicus. Incision was deepened through to expose the hernial protrusion which was then circumferentially freed. And measured approximately 5-6 cm in size. The fascial opening appeared to be a transversely oriented in approximately 2 cm in length. The sac which was somewhat thickened and scarred was opened to reveal a large portion of the omentum incarcerated into this region and adherent in places within the sac. The adhesions were freed and the omentum was taken down with Kelly clamps at the neck of the hernia was amputated and ligated with 2-0 Vicryl.. Following this the sac was excised out close to the fascial opening. A preperitoneal space was then planned. Accordingly the peritoneum was opened along with the preperitoneal space and freed all around the fascial opening. The peritoneal opening was then closed with 3 interrupted 3-0 Vicryl stitches. The preperitoneal space was freed enough to place a 6 cm Ventralex ST patch. This straps were pulled up and the edges were smoothed out under the fascia. The fascial opening was closed with interrupted figure-of-eight stitches of 0 proline incorporating the strap in the anterior aspect and the axis of the strap was trimmed. Repair was noted be secure and adequate and the wound was closed. Subcutaneous tissue was closed with  interrupted 3-0 Vicryl skin closed with subcuticular 4-0 Vicryl and sections and covered with liqui  Ban. Patient subsequently was returned recovery room in stable condition

## 2015-08-02 NOTE — Anesthesia Postprocedure Evaluation (Signed)
Anesthesia Post Note  Patient: Kathryn Hogan  Procedure(s) Performed: Procedure(s) (LRB): HERNIA REPAIR UMBILICAL ADULT (N/A)  Patient location during evaluation: PACU Anesthesia Type: General Level of consciousness: awake and alert Pain management: pain level controlled Vital Signs Assessment: post-procedure vital signs reviewed and stable Respiratory status: spontaneous breathing, nonlabored ventilation, respiratory function stable and patient connected to nasal cannula oxygen Cardiovascular status: blood pressure returned to baseline and stable Postop Assessment: no signs of nausea or vomiting Anesthetic complications: no    Last Vitals:  Filed Vitals:   08/02/15 1349 08/02/15 1358  BP:  110/62  Pulse: 87 92  Temp:  36.4 C  Resp: 11 12    Last Pain:  Filed Vitals:   08/02/15 1400  PainSc: 2                  Precious Haws Jedrick Hutcherson

## 2015-08-02 NOTE — Progress Notes (Signed)
Capillary blood sugar rechecked at 249, asymptomatic. Dr. Amie Critchley notified, no new orders.

## 2015-08-02 NOTE — Transfer of Care (Signed)
Immediate Anesthesia Transfer of Care Note  Patient: Kathryn Hogan  Procedure(s) Performed: Procedure(s): HERNIA REPAIR UMBILICAL ADULT (N/A)  Patient Location: PACU  Anesthesia Type:General  Level of Consciousness: awake, patient cooperative and responds to stimulation  Airway & Oxygen Therapy: Patient Spontanous Breathing and Patient connected to face mask oxygen  Post-op Assessment: Report given to RN, Post -op Vital signs reviewed and stable and Patient moving all extremities X 4  Post vital signs: Reviewed and stable  Last Vitals:  Filed Vitals:   08/02/15 1028 08/02/15 1253  BP: 168/94 159/84  Pulse: 92 96  Temp: 36.8 C 36.4 C  Resp: 16 19    Last Pain:  Filed Vitals:   08/02/15 1255  PainSc: Asleep         Complications: No apparent anesthesia complications

## 2015-08-02 NOTE — Interval H&P Note (Signed)
History and Physical Interval Note:  08/02/2015 10:51 AM  Kathryn Hogan  has presented today for surgery, with the diagnosis of umbilical hernia  The various methods of treatment have been discussed with the patient and family. After consideration of risks, benefits and other options for treatment, the patient has consented to  Procedure(s): HERNIA REPAIR UMBILICAL ADULT (N/A) as a surgical intervention .  The patient's history has been reviewed, patient examined, no change in status, stable for surgery.  I have reviewed the patient's chart and labs.  Questions were answered to the patient's satisfaction.     SANKAR,SEEPLAPUTHUR G

## 2015-08-02 NOTE — H&P (View-Only) (Signed)
Patient ID: RAAYA FILTER, female   DOB: 08/03/1970, 45 y.o.   MRN: [id number]  Chief Complaint  Patient presents with  . Other    umbilical hernia    HPI Kathryn Hogan is a 45 y.o. female here today from a evaluation of a umbilical hernia. Patient states she noticed this about 15 years ago. The area has got bigger over the last couple of years. She states over the last month when she is doing physical activity the area is painful. I have reviewed the history of present illness with the patient.   HPI  Past Medical History  Diagnosis Date  . Diabetes mellitus without complication ([facility])   . Hypertension   . Sleep apnea     does not use CPAP    Past Surgical History  Procedure Laterality Date  . Tubal ligation      Family History  Problem Relation Age of Onset  . Breast cancer Sister 103  . Esophageal cancer Mother     Social History Social History  Substance Use Topics  . Smoking status: Never Smoker   . Smokeless tobacco: None  . Alcohol Use: No    No Known Allergies  Current Outpatient Prescriptions  Medication Sig Dispense Refill  . BENAZEPRIL HCL PO Take 1 tablet by mouth daily.    . hydrochlorothiazide (HYDRODIURIL) 25 MG tablet Take 25 mg by mouth daily.    . metFORMIN (GLUCOPHAGE) 500 MG tablet Take 500 mg by mouth 2 (two) times daily with a meal.     No current facility-administered medications for this visit.    Review of Systems Review of Systems  Constitutional: Negative.   Respiratory: Negative.   Cardiovascular: Negative.     Blood pressure 130/76, pulse 76, resp. rate 12, height 5' (1.524 m), weight 263 lb (119.296 kg), last menstrual period 07/18/2015.  Physical Exam Physical Exam  Constitutional: She is oriented to person, place, and time. She appears well-developed and well-nourished.  Eyes: Conjunctivae are normal. No scleral icterus.  Neck: Neck supple.  Cardiovascular: Normal rate, regular rhythm and normal heart sounds.    Pulmonary/Chest: Effort normal and breath sounds normal.  Abdominal: Soft. Normal appearance and bowel sounds are normal. There is no tenderness. A hernia (4 cm umbilical hernia) is present.  Lymphadenopathy:    She has no cervical adenopathy.  Neurological: She is alert and oriented to person, place, and time.  Skin: Skin is warm and dry.    Data Reviewed Notes reviewed   Assessment    Umbilical hernia, symptomatic    Plan    Hernia precautions and incarceration were discussed with the patient. If they develop symptoms of an incarcerated hernia, they were encouraged to seek prompt medical attention.  I have recommended repair of the hernia using mesh on an outpatient basis in the near future. The risk of infection was reviewed. The role of prosthetic mesh to minimize the risk of recurrence was reviewed.     Patient is scheduled for surgery at [facility] on 08/02/15. She will pre admit by phone. Patient is aware of date and instructions.   PCP:  [facility] This information has been scribed by Gaspar Cola CMA.    SANKAR,SEEPLAPUTHUR G 07/27/2015, 8:53 AM

## 2015-08-02 NOTE — Anesthesia Procedure Notes (Signed)
Procedure Name: Intubation Date/Time: 08/02/2015 11:23 AM Performed by: Andria Frames Pre-anesthesia Checklist: Emergency Drugs available, Patient identified, Suction available, Patient being monitored and Timeout performed Patient Re-evaluated:Patient Re-evaluated prior to inductionOxygen Delivery Method: Circle system utilized Preoxygenation: Pre-oxygenation with 100% oxygen Intubation Type: IV induction Laryngoscope Size: Miller and 2 Grade View: Grade I Tube type: Oral Number of attempts: 1 Airway Equipment and Method: Stylet Placement Confirmation: ETT inserted through vocal cords under direct vision,  positive ETCO2 and breath sounds checked- equal and bilateral Secured at: 22 cm Tube secured with: Tape

## 2015-08-02 NOTE — Discharge Instructions (Signed)
Open Hernia Repair, Care After Refer to this sheet in the next few weeks. These instructions provide you with information on caring for yourself after your procedure. Your health care provider may also give you more specific instructions. Your treatment has been planned according to current medical practices, but problems sometimes occur. Call your health care provider if you have any problems or questions after your procedure. WHAT TO EXPECT AFTER THE PROCEDURE After your procedure, it is typical to have the following:  Pain in your abdomen, especially along your incision. You will be given pain medicines to control the pain.  Constipation. You may be given a stool softener to help prevent this. HOME CARE INSTRUCTIONS  Only take over-the-counter or prescription medicines as directed by your health care provider.  Keep the incision area dry and clean. You may wash the incision area gently with soap and water 48 hours after surgery. Gently blot or dab the incision area dry. Do not take baths, use swimming pools, or use hot tubs for 10 days or until your health care provider approves.  Change bandages (dressings) as directed by your health care provider.  Continue your normal diet as directed by your health care provider. Eat plenty of fruits and vegetables to help prevent constipation.  Drink enough fluids to keep your urine clear or pale yellow. This also helps prevent constipation.  Do not drive until your health care provider says it is okay.  Do not lift anything heavier than 10 lb (4.5 kg) or play contact sports for 4 weeks or until your health care provider approves.  Follow up with your health care provider as directed. Ask your health care provider when to make an appointment to have your stitches (sutures) or staples removed. SEEK MEDICAL CARE IF:  You have increased bleeding coming from the incision site.  You have blood in your stool.  You have increasing pain in the incision  area.  You see redness or swelling in the incision area.  You have fluid (pus) coming from the incision.  You have a fever.  You notice a bad smell coming from the incision area or dressing. SEEK IMMEDIATE MEDICAL CARE IF:  You develop a rash.  You have chest pain or shortness of breath.  You feel lightheaded or feel faint.   This information is not intended to replace advice given to you by your health care provider. Make sure you discuss any questions you have with your health care provider.   Document Released: 08/24/2004 Document Revised: 02/25/2014 Document Reviewed: 09/16/2012 Elsevier Interactive Patient Education 2016 [employer]   1) The drugs that you were given will stay in your system until tomorrow so for the next 24 hours you should not:  A) Drive an automobile B) Make any legal decisions C) Drink any alcoholic beverage   2) You may resume regular meals tomorrow.  Today it is better to start with liquids and gradually work up to solid foods.  You may eat anything you prefer, but it is better to start with liquids, then soup and crackers, and gradually work up to solid foods.   3) Please notify your doctor immediately if you have any unusual bleeding, trouble breathing, redness and pain at the surgery site, drainage, fever, or pain not relieved by medication.   Please contact your physician with any problems or Same Day Surgery at [phone], Monday through Friday 6 am to 4 pm, or [place]  at [facility]  Main number at [phone].

## 2015-08-02 NOTE — Anesthesia Preprocedure Evaluation (Signed)
Anesthesia Evaluation  Patient identified by MRN, date of birth, ID band Patient awake    Reviewed: Allergy & Precautions, H&P , NPO status , Patient's Chart, lab work & pertinent test results  Airway Mallampati: III  TM Distance: <3 FB Neck ROM: full    Dental  (+) Poor Dentition, Missing, Edentulous Lower, Edentulous Upper   Pulmonary neg shortness of breath, sleep apnea ,    Pulmonary exam normal breath sounds clear to auscultation       Cardiovascular Exercise Tolerance: Good hypertension, (-) angina(-) Past MI and (-) DOE Normal cardiovascular exam Rhythm:regular Rate:Normal     Neuro/Psych negative neurological ROS  negative psych ROS   GI/Hepatic negative GI ROS, Neg liver ROS,   Endo/Other  diabetes, Type 2, Insulin DependentMorbid obesity  Renal/GU negative Renal ROS  negative genitourinary   Musculoskeletal   Abdominal   Peds  Hematology negative hematology ROS (+)   Anesthesia Other Findings Past Medical History:   Diabetes mellitus without complication ([facility])                 Hypertension                                                 Sleep apnea                                                    Comment:does not use CPAP  Past Surgical History:   TUBAL LIGATION                                               BMI    Body Mass Index   49.71 kg/m 2      Reproductive/Obstetrics negative OB ROS                             Anesthesia Physical Anesthesia Plan  ASA: III  Anesthesia Plan: General ETT   Post-op Pain Management:    Induction:   Airway Management Planned:   Additional Equipment:   Intra-op Plan:   Post-operative Plan:   Informed Consent: I have reviewed the patients History and Physical, chart, labs and discussed the procedure including the risks, benefits and alternatives for the proposed anesthesia with the patient or authorized representative who has  indicated his/her understanding and acceptance.   Dental Advisory Given  Plan Discussed with: Anesthesiologist, CRNA and Surgeon  Anesthesia Plan Comments:         Anesthesia Quick Evaluation

## 2015-08-03 ENCOUNTER — Encounter: Payer: Self-pay | Admitting: General Surgery

## 2015-08-09 ENCOUNTER — Ambulatory Visit (INDEPENDENT_AMBULATORY_CARE_PROVIDER_SITE_OTHER): Payer: BLUE CROSS/BLUE SHIELD | Admitting: General Surgery

## 2015-08-09 ENCOUNTER — Ambulatory Visit: Payer: BLUE CROSS/BLUE SHIELD | Admitting: General Surgery

## 2015-08-09 ENCOUNTER — Encounter: Payer: Self-pay | Admitting: General Surgery

## 2015-08-09 VITALS — BP 124/78 | HR 82 | Resp 14 | Ht 61.0 in | Wt 271.0 lb

## 2015-08-09 DIAGNOSIS — K429 Umbilical hernia without obstruction or gangrene: Secondary | ICD-10-CM

## 2015-08-09 NOTE — Progress Notes (Signed)
This is a 45 year old female here today for her post op umbilical hernia repair with 6 cm Ventralex ST patch done on 08/02/15. Patient states she is doing well.  I have reviewed the history of present illness with the patient.  Abdomen soft, repair intact and incision healing well. Resume activities as tolerated stating in 1 week Follow up in 4-5 weeks. The patient is aware to call back for any questions or concerns.    PCP:  Dahlia Bailiff  This information has been scribed by Gaspar Cola CMA.

## 2015-08-09 NOTE — Patient Instructions (Signed)
The patient is aware to call back for any questions or concerns. Resume activities as tolerated.  

## 2015-08-10 ENCOUNTER — Encounter: Payer: Self-pay | Admitting: General Surgery

## 2015-08-16 ENCOUNTER — Emergency Department
Admission: EM | Admit: 2015-08-16 | Discharge: 2015-08-16 | Disposition: A | Discharge status: Home / Self Care | Payer: BLUE CROSS/BLUE SHIELD | Attending: Emergency Medicine | Admitting: Emergency Medicine

## 2015-08-16 DIAGNOSIS — I10 Essential (primary) hypertension: Secondary | ICD-10-CM | POA: Insufficient documentation

## 2015-08-16 DIAGNOSIS — Z794 Long term (current) use of insulin: Secondary | ICD-10-CM | POA: Diagnosis not present

## 2015-08-16 DIAGNOSIS — Z79899 Other long term (current) drug therapy: Secondary | ICD-10-CM | POA: Diagnosis not present

## 2015-08-16 DIAGNOSIS — Z7984 Long term (current) use of oral hypoglycemic drugs: Secondary | ICD-10-CM | POA: Diagnosis not present

## 2015-08-16 DIAGNOSIS — Z7901 Long term (current) use of anticoagulants: Secondary | ICD-10-CM | POA: Diagnosis not present

## 2015-08-16 DIAGNOSIS — T814XXA Infection following a procedure, initial encounter: Secondary | ICD-10-CM | POA: Diagnosis not present

## 2015-08-16 DIAGNOSIS — E119 Type 2 diabetes mellitus without complications: Secondary | ICD-10-CM | POA: Insufficient documentation

## 2015-08-16 DIAGNOSIS — L089 Local infection of the skin and subcutaneous tissue, unspecified: Secondary | ICD-10-CM

## 2015-08-16 DIAGNOSIS — T148XXA Other injury of unspecified body region, initial encounter: Secondary | ICD-10-CM

## 2015-08-16 DIAGNOSIS — Y738 Miscellaneous gastroenterology and urology devices associated with adverse incidents, not elsewhere classified: Secondary | ICD-10-CM | POA: Diagnosis not present

## 2015-08-16 LAB — GLUCOSE, CAPILLARY: GLUCOSE-CAPILLARY: 264 mg/dL — AB (ref 65–99)

## 2015-08-16 MED ORDER — SULFAMETHOXAZOLE-TRIMETHOPRIM 800-160 MG PO TABS
2.0000 | ORAL_TABLET | Freq: Once | ORAL | Status: AC
  Administered 2015-08-16: 2 via ORAL
  Filled 2015-08-16: qty 2

## 2015-08-16 MED ORDER — SULFAMETHOXAZOLE-TRIMETHOPRIM 800-160 MG PO TABS: 2.0000 | ORAL_TABLET | Freq: Two times a day (BID) | ORAL | Status: DC

## 2015-08-16 NOTE — Discharge Instructions (Signed)
Use your home triple antibiotic twice a day to the wound. Make sure that the wound stays dry.    Cellulitis Cellulitis is an infection of the skin and the tissue beneath it. The infected area is usually red and tender. Cellulitis occurs most often in the arms and lower legs.  CAUSES  Cellulitis is caused by bacteria that enter the skin through cracks or cuts in the skin. The most common types of bacteria that cause cellulitis are staphylococci and streptococci. SIGNS AND SYMPTOMS   Redness and warmth.  Swelling.  Tenderness or pain.  Fever. DIAGNOSIS  Your health care provider can usually determine what is wrong based on a physical exam. Blood tests may also be done. TREATMENT  Treatment usually involves taking an antibiotic medicine. HOME CARE INSTRUCTIONS   Take your antibiotic medicine as directed by your health care provider. Finish the antibiotic even if you start to feel better.  Keep the infected arm or leg elevated to reduce swelling.  Apply a warm cloth to the affected area up to 4 times per day to relieve pain.  Take medicines only as directed by your health care provider.  Keep all follow-up visits as directed by your health care provider. SEEK MEDICAL CARE IF:   You notice red streaks coming from the infected area.  Your red area gets larger or turns dark in color.  Your bone or joint underneath the infected area becomes painful after the skin has healed.  Your infection returns in the same area or another area.  You notice a swollen bump in the infected area.  You develop new symptoms.  You have a fever. SEEK IMMEDIATE MEDICAL CARE IF:   You feel very sleepy.  You develop vomiting or diarrhea.  You have a general ill feeling (malaise) with muscle aches and pains.   This information is not intended to replace advice given to you by your health care provider. Make sure you discuss any questions you have with your health care provider.   Document  Released: 11/14/2004 Document Revised: 10/26/2014 Document Reviewed: 04/22/2011 Elsevier Interactive Patient Education [employer].

## 2015-08-16 NOTE — ED Notes (Addendum)
Pt arrives to ER via POV c/o drainage and odor from hernia repair incision. PT states drainage began today. Operation X 2 weeks ago. Pt alert and oriented X4, active, cooperative, pt in NAD. RR even and unlabored, color WNL.   Wound at umbilicus; scant amount of brown drainage, no noticeable smell, wound intact, pink edges.

## 2015-08-16 NOTE — ED Provider Notes (Addendum)
[facility] Emergency Department Provider Note   ____________________________________________  Time seen: Approximately 750 PM  I have reviewed the triage vital signs and the nursing notes.   HISTORY  Chief Complaint Post-op Problem   HPI Kathryn Hogan is a 45 y.o. female with a history of diabetes and a recent hernia repair who is presenting to the emergency department with drainage from her incision site. Her surgery was 2 weeks ago. Says that over the past 3 days she has noticed a foul-smelling drainage from the incision. She cites only a minimal amount of pain. No nausea or vomiting. Does not report any fevers. Says that she has been out of her needles for her insulin over the past 3 days was not been taking it. However, she has obtained her insulin needles today and says will begin taking it as soon as possible.   Past Medical History  Diagnosis Date  . Diabetes mellitus without complication ([place])   . Hypertension   . Sleep apnea     does not use CPAP    There are no active problems to display for this patient.   Past Surgical History  Procedure Laterality Date  . Tubal ligation    . Umbilical hernia repair N/A 08/02/2015    Procedure: HERNIA REPAIR UMBILICAL ADULT;  Surgeon: Christene Lye, MD;  Location: [facility] ORS;  Service: General;  Laterality: N/A;    Current Outpatient Rx  Name  Route  Sig  Dispense  Refill  . amLODipine (NORVASC) 5 MG tablet   Oral   Take 5 mg by mouth daily.         Marland Kitchen BENAZEPRIL HCL PO   Oral   Take 40 mg by mouth daily.          . hydrochlorothiazide (HYDRODIURIL) 25 MG tablet   Oral   Take 25 mg by mouth daily.         . Insulin Detemir (LEVEMIR) 100 UNIT/ML Pen   Subcutaneous   Inject 20 Units into the skin 2 (two) times daily.         . metFORMIN (GLUCOPHAGE) 500 MG tablet   Oral   Take 1,000 mg by mouth 2 (two) times daily with a meal.          . naproxen sodium (ANAPROX) 220 MG  tablet   Oral   Take 220 mg by mouth 2 (two) times daily.         Marland Kitchen oxyCODONE-acetaminophen (ROXICET) 5-325 MG tablet   Oral   Take 1 tablet by mouth every 4 (four) hours as needed.   30 tablet   0     Allergies Review of patient's allergies indicates no known allergies.  Family History  Problem Relation Age of Onset  . Breast cancer Sister 38  . Esophageal cancer Mother     Social History Social History  Substance Use Topics  . Smoking status: Never Smoker   . Smokeless tobacco: None  . Alcohol Use: No    Review of Systems Constitutional: No fever/chills Eyes: No visual changes. ENT: No sore throat. Cardiovascular: Denies chest pain. Respiratory: Denies shortness of breath. Gastrointestinal: No abdominal pain.  No nausea, no vomiting.   No constipation. Genitourinary: Negative for dysuria. Musculoskeletal: Negative for back pain. Skin: Negative for rash. Neurological: Negative for headaches, focal weakness or numbness.  10-point ROS otherwise negative.  ____________________________________________   PHYSICAL EXAM:  VITAL SIGNS: ED Triage Vitals  Enc Vitals Group  BP 08/16/15 1848 141/87 mmHg     Pulse Rate 08/16/15 1848 109     Resp 08/16/15 1848 18     Temp 08/16/15 1848 98.6 F (37 C)     Temp Source 08/16/15 1848 Oral     SpO2 08/16/15 1848 96 %     Weight 08/16/15 1848 271 lb (122.925 kg)     Height --      Head Cir --      Peak Flow --      Pain Score 08/16/15 1849 5     Pain Loc --      Pain Edu? --      Excl. in [place]? --     Constitutional: Alert and oriented. Well appearing and in no acute distress. Eyes: Conjunctivae are normal. PERRL. EOMI. Head: Atraumatic. Nose: No congestion/rhinnorhea. Mouth/Throat: Mucous membranes are moist.  Oropharynx non-erythematous. Neck: No stridor.   Cardiovascular: Normal rate, regular rhythm. Grossly normal heart sounds.   Respiratory: Normal respiratory effort.  No retractions. Lungs  CTAB. Gastrointestinal: Soft and nontender. No distention.  Musculoskeletal: No lower extremity tenderness nor edema.  No joint effusions. Neurologic:  Normal speech and language. No gross focal neurologic deficits are appreciated. No gait instability. Skin:  Umbilical hernia incision is intact. However, has about a 2 mm border of erythema with a very small amount of exudative discharge from the incision line. No tenderness. No fluctuance. Does have a foul odor. Psychiatric: Mood and affect are normal. Speech and behavior are normal.  ____________________________________________   LABS (all labs ordered are listed, but only abnormal results are displayed)  Labs Reviewed  GLUCOSE, CAPILLARY - Abnormal; Notable for the following:    Glucose-Capillary 264 (*)    All other components within normal limits  CBG MONITORING, ED   ____________________________________________  EKG   ____________________________________________  RADIOLOGY   ____________________________________________   PROCEDURES   ____________________________________________   INITIAL IMPRESSION / ASSESSMENT AND PLAN / ED COURSE  Pertinent labs & imaging results that were available during my care of the patient were reviewed by me and considered in my medical decision making (see chart for details).  I will start the patient on Bactrim. She said that she will be calling tomorrow to her surgeon, Dr. Jamal Collin, to schedule follow-up for wound check. She also has triple erratic at home which she will be using twice a day to the incision site. We discussed her blood sugar and the resumption of her insulin regimen. ____________________________________________   FINAL CLINICAL IMPRESSION(S) / ED DIAGNOSES  Wound infection.    NEW MEDICATIONS STARTED DURING THIS VISIT:  New Prescriptions   No medications on file     Note:  This document was prepared using Dragon voice recognition software and may include  unintentional dictation errors.    Orbie Pyo, MD 08/16/15 2028  ----------------------------------------- 8:29 PM on 08/16/2015 -----------------------------------------  Heart rate upon reevaluation is 91 bpm.  Orbie Pyo, MD 08/16/15 2029

## 2015-08-16 NOTE — ED Notes (Signed)
Pt presents c/o foul odor and drainage to area of surgical hernia repair. Pt states it began draining and smelling bad today. Pt alert and oriented with NAD noted.

## 2015-08-24 ENCOUNTER — Encounter: Payer: Self-pay | Admitting: General Surgery

## 2015-08-24 ENCOUNTER — Ambulatory Visit (INDEPENDENT_AMBULATORY_CARE_PROVIDER_SITE_OTHER): Payer: BLUE CROSS/BLUE SHIELD | Admitting: General Surgery

## 2015-08-24 VITALS — BP 132/68 | HR 90 | Resp 16 | Ht 61.0 in | Wt 265.0 lb

## 2015-08-24 DIAGNOSIS — K429 Umbilical hernia without obstruction or gangrene: Secondary | ICD-10-CM

## 2015-08-24 NOTE — Patient Instructions (Addendum)
The patient is aware to call back for any questions or concerns. Continue with neosporin, finish course of antibiotics. Follow-up with PCP for diabetes control.

## 2015-08-24 NOTE — Progress Notes (Signed)
Patient ID: Kathryn Hogan, female   DOB: [birth year], 45 y.o.   MRN: [id number]  She is postop hernia repair on 08-02-15. She notice drainage on Sunday, she ended up going to the ED 08-15-17 and patient states that she had been off of her insulin for three days when she noticed the discharge and the smell coming from her incision. No culture had been done at the ED.  She was given an antibiotic to take. She states the area looks better and the smell is gone. I have reviewed the history of present illness with the patient.  Superificial opening along incision, no induration, no erythema, no active drainage noted. Repair remains intact  Continue with neosporin, finish course of antibiotics. Follow-up with PCP for DM control.  Return here as scheduled  PCP:  [facility]  This information has been scribed by Karie Fetch RN, BSN,BC.

## 2015-09-07 ENCOUNTER — Ambulatory Visit (INDEPENDENT_AMBULATORY_CARE_PROVIDER_SITE_OTHER): Payer: BLUE CROSS/BLUE SHIELD | Admitting: General Surgery

## 2015-09-07 ENCOUNTER — Encounter: Payer: Self-pay | Admitting: General Surgery

## 2015-09-07 VITALS — BP 122/68 | HR 82 | Resp 12 | Ht 61.0 in | Wt 269.0 lb

## 2015-09-07 DIAGNOSIS — K429 Umbilical hernia without obstruction or gangrene: Secondary | ICD-10-CM

## 2015-09-07 DIAGNOSIS — Z1211 Encounter for screening for malignant neoplasm of colon: Secondary | ICD-10-CM

## 2015-09-07 MED ORDER — POLYETHYLENE GLYCOL 3350 17 GM/SCOOP PO POWD: 1.0000 | Freq: Once | ORAL | Status: DC

## 2015-09-07 NOTE — Patient Instructions (Addendum)
Colonoscopy A colonoscopy is an exam to look at the entire large intestine (colon). This exam can help find problems such as tumors, polyps, inflammation, and areas of bleeding. The exam takes about 1 hour.  LET [facility] CARE PROVIDER KNOW ABOUT:   Any allergies you have.  All medicines you are taking, including vitamins, herbs, eye drops, creams, and over-the-counter medicines.  Previous problems you or members of your family have had with the use of anesthetics.  Any blood disorders you have.  Previous surgeries you have had.  Medical conditions you have. RISKS AND COMPLICATIONS  Generally, this is a safe procedure. However, as with any procedure, complications can occur. Possible complications include:  Bleeding.  Tearing or rupture of the colon wall.  Reaction to medicines given during the exam.  Infection (rare). BEFORE THE PROCEDURE   Ask your health care provider about changing or stopping your regular medicines.  You may be prescribed an oral bowel prep. This involves drinking a large amount of medicated liquid, starting the day before your procedure. The liquid will cause you to have multiple loose stools until your stool is almost clear or light green. This cleans out your colon in preparation for the procedure.  Do not eat or drink anything else once you have started the bowel prep, unless your health care provider tells you it is safe to do so.  Arrange for someone to drive you home after the procedure. PROCEDURE   You will be given medicine to help you relax (sedative).  You will lie on your side with your knees bent.  A long, flexible tube with a light and camera on the end (colonoscope) will be inserted through the rectum and into the colon. The camera sends video back to a computer screen as it moves through the colon. The colonoscope also releases carbon dioxide gas to inflate the colon. This helps your health care provider see the area better.  During  the exam, your health care provider may take a small tissue sample (biopsy) to be examined under a microscope if any abnormalities are found.  The exam is finished when the entire colon has been viewed. AFTER THE PROCEDURE   Do not drive for 24 hours after the exam.  You may have a small amount of blood in your stool.  You may pass moderate amounts of gas and have mild abdominal cramping or bloating. This is caused by the gas used to inflate your colon during the exam.  Ask when your test results will be ready and how you will get your results. Make sure you get your test results.   This information is not intended to replace advice given to you by your health care provider. Make sure you discuss any questions you have with your health care provider.   Document Released: 02/02/2000 Document Revised: 11/25/2012 Document Reviewed: 10/12/2012 Elsevier Interactive Patient Education [employer].  The patient is scheduled for a Colonoscopy at [facility] on 10/18/15. She will hold her Metformin the day of prep and procedure. She will hold her Levemir the day of prep and the morning of procedure. She will only take her blood pressure medications at 6 am with a small sip of water the morning of the procedure. They are aware to call the day before to get their arrival time. Miralax prescription has been sent into the patient's pharmacy. The patient is aware of date and instructions.

## 2015-09-07 NOTE — Progress Notes (Signed)
Patient ID: Kathryn Hogan, female   DOB: [birth year], 45 y.o.   MRN: [id number]   Patient is here today for a 4 week post op follow up from an Umbilical hernia done on 08/02/15. She states she is doing well. She finished her course of antibiotics. Interested in getting some information regarding cancer screening based on her family history. No other concerns.  Umbilical hernia repair is intact and healing well.   Based on a family history significant for colon cancer (Father, 34), the patient was suggested to undergo a screening colonoscopy.  Information regarding the procedure, including its potential risks and complications (including but not limited to perforation of the bowel, which may require emergency surgery to repair, and bleeding) was verbally given to the patient. Educational information regarding lower intestinal endoscopy was given to the patient. Written instructions for how to complete the bowel prep using Miralax were provided. The importance of drinking ample fluids to avoid dehydration as a result of the prep emphasized. Advised also given her sister having breast cancer that she needs to be evaluated with an exam and mammogram on a yearly basis. At present there is no indication for genetic testing.   This information has been scribed by Gaspar Cola CMA. The patient is scheduled for a Colonoscopy at [facility] on 10/18/15. She will hold her Metformin the day of prep and procedure. She will hold her Levemir the day of prep and the morning of procedure. She will only take her blood pressure medications at 6 am with a small sip of water the morning of the procedure. They are aware to call the day before to get their arrival time. Miralax prescription has been sent into the patient's pharmacy. The patient is aware of date and instructions.

## 2015-10-03 ENCOUNTER — Other Ambulatory Visit: Payer: Self-pay | Admitting: General Surgery

## 2015-10-17 ENCOUNTER — Encounter: Payer: Self-pay | Admitting: *Deleted

## 2015-10-18 ENCOUNTER — Ambulatory Visit
Admission: RE | Admit: 2015-10-18 | Payer: BLUE CROSS/BLUE SHIELD | Source: Ambulatory Visit | Admitting: General Surgery

## 2015-10-18 ENCOUNTER — Encounter: Admission: RE | Payer: Self-pay | Source: Ambulatory Visit

## 2015-10-18 SURGERY — COLONOSCOPY WITH PROPOFOL
Anesthesia: General

## 2016-04-10 ENCOUNTER — Encounter: Payer: Self-pay | Admitting: General Surgery

## 2016-06-13 ENCOUNTER — Encounter: Payer: Self-pay | Admitting: General Surgery

## 2016-07-14 ENCOUNTER — Emergency Department (HOSPITAL_COMMUNITY)
Admission: EM | Admit: 2016-07-14 | Discharge: 2016-07-14 | Disposition: A | Discharge status: Home / Self Care | Payer: BLUE CROSS/BLUE SHIELD | Attending: Emergency Medicine | Admitting: Emergency Medicine

## 2016-07-14 ENCOUNTER — Encounter (HOSPITAL_COMMUNITY): Payer: Self-pay

## 2016-07-14 DIAGNOSIS — E1165 Type 2 diabetes mellitus with hyperglycemia: Secondary | ICD-10-CM | POA: Diagnosis not present

## 2016-07-14 DIAGNOSIS — Z79899 Other long term (current) drug therapy: Secondary | ICD-10-CM | POA: Insufficient documentation

## 2016-07-14 DIAGNOSIS — Z794 Long term (current) use of insulin: Secondary | ICD-10-CM | POA: Insufficient documentation

## 2016-07-14 DIAGNOSIS — I160 Hypertensive urgency: Secondary | ICD-10-CM | POA: Diagnosis not present

## 2016-07-14 DIAGNOSIS — H3322 Serous retinal detachment, left eye: Secondary | ICD-10-CM

## 2016-07-14 DIAGNOSIS — H5712 Ocular pain, left eye: Secondary | ICD-10-CM | POA: Diagnosis present

## 2016-07-14 DIAGNOSIS — R739 Hyperglycemia, unspecified: Secondary | ICD-10-CM

## 2016-07-14 DIAGNOSIS — H33002 Unspecified retinal detachment with retinal break, left eye: Secondary | ICD-10-CM | POA: Diagnosis not present

## 2016-07-14 LAB — I-STAT CHEM 8, ED
BUN: 10 mg/dL (ref 6–20)
CHLORIDE: 98 mmol/L — AB (ref 101–111)
Calcium, Ion: 1.12 mmol/L — ABNORMAL LOW (ref 1.15–1.40)
Creatinine, Ser: 0.5 mg/dL (ref 0.44–1.00)
GLUCOSE: 307 mg/dL — AB (ref 65–99)
HEMATOCRIT: 37 % (ref 36.0–46.0)
HEMOGLOBIN: 12.6 g/dL (ref 12.0–15.0)
POTASSIUM: 3.9 mmol/L (ref 3.5–5.1)
SODIUM: 135 mmol/L (ref 135–145)
TCO2: 28 mmol/L (ref 0–100)

## 2016-07-14 LAB — CBG MONITORING, ED: Glucose-Capillary: 371 mg/dL — ABNORMAL HIGH (ref 65–99)

## 2016-07-14 MED ORDER — BENAZEPRIL HCL 40 MG PO TABS: 40.0000 mg | ORAL_TABLET | Freq: Every day | ORAL | 0 refills | Status: DC

## 2016-07-14 MED ORDER — HYDROCHLOROTHIAZIDE 25 MG PO TABS: 25.0000 mg | ORAL_TABLET | Freq: Every day | ORAL | 0 refills | Status: DC

## 2016-07-14 MED ORDER — METFORMIN HCL 500 MG PO TABS: 1000.0000 mg | ORAL_TABLET | Freq: Two times a day (BID) | ORAL | 0 refills | Status: DC

## 2016-07-14 MED ORDER — AMLODIPINE BESYLATE 5 MG PO TABS
5.0000 mg | ORAL_TABLET | Freq: Every day | ORAL | Status: DC
  Administered 2016-07-14: 5 mg via ORAL
  Filled 2016-07-14: qty 1

## 2016-07-14 MED ORDER — HYDROCHLOROTHIAZIDE 25 MG PO TABS
25.0000 mg | ORAL_TABLET | Freq: Every day | ORAL | Status: DC
  Administered 2016-07-14: 25 mg via ORAL
  Filled 2016-07-14: qty 1

## 2016-07-14 MED ORDER — AMLODIPINE BESYLATE 5 MG PO TABS: 5.0000 mg | ORAL_TABLET | Freq: Every day | ORAL | 0 refills | Status: DC

## 2016-07-14 MED ORDER — INSULIN DETEMIR 100 UNIT/ML FLEXPEN: 20.0000 [IU] | PEN_INJECTOR | Freq: Two times a day (BID) | SUBCUTANEOUS | 0 refills | Status: DC

## 2016-07-14 MED ORDER — BENAZEPRIL HCL 40 MG PO TABS
40.0000 mg | ORAL_TABLET | Freq: Every day | ORAL | Status: DC
  Administered 2016-07-14: 40 mg via ORAL
  Filled 2016-07-14: qty 1

## 2016-07-14 NOTE — ED Notes (Signed)
Dr. Regenia Skeeter at bedside to assess patient.

## 2016-07-14 NOTE — ED Notes (Signed)
Pt reports she has not taken any of her daily meds today. Pharmacy notified to please send the Benazepril

## 2016-07-14 NOTE — ED Provider Notes (Signed)
[facility] DEPT Provider Note   CSN: [id number] Arrival date & time: 07/14/16  1019     History   Chief Complaint Chief Complaint  Patient presents with  . Eye Problem    HPI Kathryn Hogan is a 46 y.o. female.  HPI  46 year old female with a history of diabetes and hypertension presents with difficulty seeing. 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 She states she's had floaters before her left eye that she had removed by laser a month or 2 ago. She's not reviewed did this. She states that she was told this was from her diabetes. The floaters came back 5 days ago. However this morning she started developing a streaky red line that went from top to bottom of her vision. It seems to move around. Causes her to have decreased vision in that eye. There is no eye pain, redness, discharge, or trauma. She also has been out of her blood pressure and diabetes medicines for 1 week. She has a very mild headache currently but denies any weakness, numbness, or chest pain.  Past Medical History:  Diagnosis Date  . Diabetes mellitus without complication ([place])   . Hypertension   . Sleep apnea    does not use CPAP    There are no active problems to display for this patient.   Past Surgical History:  Procedure Laterality Date  . TUBAL LIGATION    . UMBILICAL HERNIA REPAIR N/A 08/02/2015   Procedure: HERNIA REPAIR UMBILICAL ADULT;  Surgeon: Christene Lye, MD;  Location: [facility] ORS;  Service: General;  Laterality: N/A;    OB History    No data available       Home Medications    Prior to Admission medications   Medication Sig Start Date End Date Taking? Authorizing Provider  amLODipine (NORVASC) 5 MG tablet Take 5 mg by mouth daily.    [provider]  BENAZEPRIL HCL PO Take 40 mg by mouth daily.     [provider]  hydrochlorothiazide (HYDRODIURIL) 25 MG tablet  Take 25 mg by mouth daily.    [provider]  Insulin Detemir (LEVEMIR) 100 UNIT/ML Pen Inject 20 Units into the skin 2 (two) times daily.    [provider]  metFORMIN (GLUCOPHAGE) 500 MG tablet Take 1,000 mg by mouth 2 (two) times daily with a meal.     [provider]  naproxen sodium (ANAPROX) 220 MG tablet Take 220 mg by mouth 2 (two) times daily.    [provider]  polyethylene glycol powder (GLYCOLAX/MIRALAX) powder Take 255 g by mouth once. 09/07/15   Christene Lye, MD    Family History Family History  Problem Relation Age of Onset  . Breast cancer Sister 66  . Esophageal cancer Mother   . Colon cancer Father   . Bone cancer Sister     Social History Social History  Substance Use Topics  . Smoking status: Never Smoker  . Smokeless tobacco: Never Used  . Alcohol use No     Allergies   Patient has no known allergies.   Review of Systems Review of Systems  Eyes: Positive for visual disturbance. Negative for photophobia, pain and redness.  Cardiovascular: Negative for chest pain.  Gastrointestinal: Negative for abdominal pain.  Neurological: Positive for headaches. Negative for weakness and numbness.  All other systems reviewed and are negative.  Physical Exam Updated Vital Signs BP (!) 204/116 (BP Location: Left Arm)   Pulse (!) 104   Temp 98.4 F (36.9 C) (Oral)   Resp 18   SpO2 96%   Physical Exam  Constitutional: She is oriented to person, place, and time. She appears well-developed and well-nourished. No distress.  obese  HENT:  Head: Normocephalic and atraumatic.  Right Ear: External ear normal.  Left Ear: External ear normal.  Nose: Nose normal.  Eyes: Conjunctivae, EOM and lids are normal. Pupils are equal, round, and reactive to light. Right eye exhibits no discharge. Left eye exhibits no discharge.  Cardiovascular: Normal rate, regular rhythm and normal heart sounds.   Pulmonary/Chest: Effort  normal and breath sounds normal.  Abdominal: Soft. There is no tenderness.  Neurological: She is alert and oriented to person, place, and time.  Skin: Skin is warm and dry. She is not diaphoretic.  Nursing note and vitals reviewed.    ED Treatments / Results  Labs (all labs ordered are listed, but only abnormal results are displayed) Labs Reviewed  CBG MONITORING, ED - Abnormal; Notable for the following:       Result Value   Glucose-Capillary 371 (*)    All other components within normal limits  I-STAT CHEM 8, ED    EKG  EKG Interpretation None       Radiology No results found.  Procedures Procedures (including critical care time)  EMERGENCY DEPARTMENT [place] OCULAR EXAM "Study: Limited Ultrasound of Orbit "  INDICATIONS: Vision loss and Floaters/Flashes Linear probe utilized to obtain images in both long and short axis of the orbit having the patient look left and right if possible.  PERFORMED BY: Myself IMAGES ARCHIVED?: Yes LIMITATIONS: none VIEWS USED: Left orbit INTERPRETATION: Retinal detachment present, Lens in proper position  Medications Ordered in ED Medications - No data to display   Initial Impression / Assessment and Plan / ED Course  I have reviewed the triage vital signs and the nursing notes.  Pertinent labs & imaging results that were available during my care of the patient were reviewed by me and considered in my medical decision making (see chart for details).     Ultrasound appears to show retinal detachment. D/w Dr. Kristeen Miss, will sen in her office at 3 pm today for further evaluation. As for her hyperglycemia, no signs of acidosis or anion gap elevation. Will restart her insulin that she has been off for a week. She otherwise has a symptomatically hypotension. Was restarted on her oral meds here and given prescriptions for these. Follow closely with PCP as well.  Final Clinical Impressions(s) / ED Diagnoses   Final diagnoses:  Left retinal  detachment  Hyperglycemia  Hypertensive urgency    New Prescriptions New Prescriptions   No medications on file     Sherwood Gambler, MD 07/14/16 [id number]

## 2016-07-14 NOTE — ED Notes (Signed)
Pt A&Ox4, ambulatory at d/c with independent steady gait and pt verbalized understanding of d/c instructions and follow up care.

## 2016-07-14 NOTE — ED Triage Notes (Signed)
Patient here complaining of 2 days of left eye blurred vision and symptoms similar when she had detached retina in past. States she can see a line running through eye. Has also ben out of BP medication and test strips for a week or greater. Denies eye pain. Alert and oriented. Reports that he symptoms started on Tuesday

## 2017-03-24 ENCOUNTER — Emergency Department: Payer: PRIVATE HEALTH INSURANCE

## 2017-03-24 ENCOUNTER — Encounter: Payer: Self-pay | Admitting: Emergency Medicine

## 2017-03-24 ENCOUNTER — Other Ambulatory Visit: Payer: Self-pay

## 2017-03-24 ENCOUNTER — Emergency Department (HOSPITAL_COMMUNITY)
Admission: EM | Admit: 2017-03-24 | Discharge: 2017-03-24 | Discharge status: Against Medical Advice | Payer: BLUE CROSS/BLUE SHIELD

## 2017-03-24 ENCOUNTER — Emergency Department
Admission: EM | Admit: 2017-03-24 | Discharge: 2017-03-24 | Disposition: A | Discharge status: Home / Self Care | Payer: PRIVATE HEALTH INSURANCE | Attending: Emergency Medicine | Admitting: Emergency Medicine

## 2017-03-24 DIAGNOSIS — L03317 Cellulitis of buttock: Secondary | ICD-10-CM | POA: Insufficient documentation

## 2017-03-24 DIAGNOSIS — E119 Type 2 diabetes mellitus without complications: Secondary | ICD-10-CM | POA: Insufficient documentation

## 2017-03-24 DIAGNOSIS — R509 Fever, unspecified: Secondary | ICD-10-CM | POA: Insufficient documentation

## 2017-03-24 DIAGNOSIS — Z794 Long term (current) use of insulin: Secondary | ICD-10-CM | POA: Insufficient documentation

## 2017-03-24 DIAGNOSIS — I10 Essential (primary) hypertension: Secondary | ICD-10-CM | POA: Diagnosis not present

## 2017-03-24 DIAGNOSIS — R222 Localized swelling, mass and lump, trunk: Secondary | ICD-10-CM | POA: Diagnosis present

## 2017-03-24 DIAGNOSIS — Z79899 Other long term (current) drug therapy: Secondary | ICD-10-CM | POA: Diagnosis not present

## 2017-03-24 LAB — LACTIC ACID, PLASMA: Lactic Acid, Venous: 1.3 mmol/L (ref 0.5–1.9)

## 2017-03-24 LAB — COMPREHENSIVE METABOLIC PANEL
ALT: 10 U/L — ABNORMAL LOW (ref 14–54)
AST: 15 U/L (ref 15–41)
Albumin: 2.9 g/dL — ABNORMAL LOW (ref 3.5–5.0)
Alkaline Phosphatase: 114 U/L (ref 38–126)
Anion gap: 12 (ref 5–15)
BILIRUBIN TOTAL: 0.4 mg/dL (ref 0.3–1.2)
BUN: 16 mg/dL (ref 6–20)
CHLORIDE: 96 mmol/L — AB (ref 101–111)
CO2: 25 mmol/L (ref 22–32)
CREATININE: 0.78 mg/dL (ref 0.44–1.00)
Calcium: 8.8 mg/dL — ABNORMAL LOW (ref 8.9–10.3)
Glucose, Bld: 350 mg/dL — ABNORMAL HIGH (ref 65–99)
POTASSIUM: 3.4 mmol/L — AB (ref 3.5–5.1)
Sodium: 133 mmol/L — ABNORMAL LOW (ref 135–145)
Total Protein: 7 g/dL (ref 6.5–8.1)

## 2017-03-24 LAB — URINALYSIS, COMPLETE (UACMP) WITH MICROSCOPIC
Bilirubin Urine: NEGATIVE
Glucose, UA: >=500 mg/dL — AB
Ketones, ur: NEGATIVE mg/dL
Leukocytes, UA: NEGATIVE
Nitrite: NEGATIVE
PH: 5 (ref 5.0–8.0)
PROTEIN: 100 mg/dL — AB
Specific Gravity, Urine: 1.017 (ref 1.005–1.030)

## 2017-03-24 LAB — CBC WITH DIFFERENTIAL/PLATELET
BASOS PCT: 1 %
Basophils Absolute: 0.1 10*3/uL (ref 0–0.1)
EOS ABS: 0.1 10*3/uL (ref 0–0.7)
EOS PCT: 1 %
HEMATOCRIT: 30.1 % — AB (ref 35.0–47.0)
Hemoglobin: 9.9 g/dL — ABNORMAL LOW (ref 12.0–16.0)
Lymphocytes Relative: 15 %
Lymphs Abs: 2 10*3/uL (ref 1.0–3.6)
MCH: 24.7 pg — ABNORMAL LOW (ref 26.0–34.0)
MCHC: 32.8 g/dL (ref 32.0–36.0)
MCV: 75.2 fL — ABNORMAL LOW (ref 80.0–100.0)
MONO ABS: 0.9 10*3/uL (ref 0.2–0.9)
MONOS PCT: 7 %
NEUTROS ABS: 10.6 10*3/uL — AB (ref 1.4–6.5)
Neutrophils Relative %: 78 %
Platelets: 270 10*3/uL (ref 150–440)
RBC: 4 MIL/uL (ref 3.80–5.20)
RDW: 14 % (ref 11.5–14.5)
WBC: 13.7 10*3/uL — ABNORMAL HIGH (ref 3.6–11.0)

## 2017-03-24 LAB — POCT PREGNANCY, URINE: Preg Test, Ur: NEGATIVE

## 2017-03-24 MED ORDER — HYDROCODONE-ACETAMINOPHEN 5-325 MG PO TABS: 1.0000 | ORAL_TABLET | ORAL | 0 refills | Status: DC | PRN

## 2017-03-24 MED ORDER — SODIUM CHLORIDE 0.9 % IV BOLUS (SEPSIS)
1000.0000 mL | Freq: Once | INTRAVENOUS | Status: AC
Start: 2017-03-24 — End: 2017-03-24
  Administered 2017-03-24: 1000 mL via INTRAVENOUS

## 2017-03-24 MED ORDER — SODIUM CHLORIDE 0.9 % IV BOLUS (SEPSIS)
1000.0000 mL | Freq: Once | INTRAVENOUS | Status: AC
  Administered 2017-03-24: 1000 mL via INTRAVENOUS

## 2017-03-24 MED ORDER — CLINDAMYCIN HCL 300 MG PO CAPS: 300.0000 mg | ORAL_CAPSULE | Freq: Four times a day (QID) | ORAL | 0 refills | Status: DC

## 2017-03-24 MED ORDER — IOPAMIDOL (ISOVUE-300) INJECTION 61%
15.0000 mL | INTRAVENOUS | Status: AC
  Filled 2017-03-24 (×2): qty 15

## 2017-03-24 MED ORDER — IOPAMIDOL (ISOVUE-300) INJECTION 61%
125.0000 mL | Freq: Once | INTRAVENOUS | Status: AC | PRN
  Administered 2017-03-24: 125 mL via INTRAVENOUS
  Filled 2017-03-24: qty 150

## 2017-03-24 MED ORDER — ONDANSETRON HCL 4 MG/2ML IJ SOLN
4.0000 mg | Freq: Once | INTRAMUSCULAR | Status: AC
  Administered 2017-03-24: 4 mg via INTRAVENOUS
  Filled 2017-03-24: qty 2

## 2017-03-24 MED ORDER — MORPHINE SULFATE (PF) 4 MG/ML IV SOLN
4.0000 mg | Freq: Once | INTRAVENOUS | Status: AC
  Administered 2017-03-24: 4 mg via INTRAVENOUS
  Filled 2017-03-24: qty 1

## 2017-03-24 MED ORDER — CLINDAMYCIN PHOSPHATE 900 MG/50ML IV SOLN
900.0000 mg | Freq: Once | INTRAVENOUS | Status: AC
  Administered 2017-03-24: 900 mg via INTRAVENOUS
  Filled 2017-03-24: qty 50

## 2017-03-24 NOTE — ED Notes (Signed)
Called x1. No answer.

## 2017-03-24 NOTE — ED Notes (Signed)
See triage note  States she noticed an abscess area to buttocks about 1 week ago  Today states area started to drain  Unsure of fever at home but febrile on arrival

## 2017-03-24 NOTE — ED Notes (Signed)
Called x 2. No answer 

## 2017-03-24 NOTE — ED Triage Notes (Signed)
Abscess R buttock x 1 week, draining.

## 2017-03-24 NOTE — ED Provider Notes (Signed)
[facility] Emergency Department Provider Note  ____________________________________________  Time seen: Approximately 6:39 PM  I have reviewed the triage vital signs and the nursing notes.   HISTORY  Chief Complaint Abscess    HPI Kathryn Hogan is a 47 y.o. female who presents the emergency department complaining of large abscess to the left buttocks.  Patient reports that it initially showed up a week ago as a "pimple".  Patient reports that she "popped" the area.  This area has significantly grown and "opened and drained" today.  Patient reports that over the past several days she has noticed some rectal bleeding.  Patient denies any abdominal pain, dysuria, pyuria, hematuria.  No history of recurrent skin lesions.  Patient denies fever but is febrile here in the emergency department.  Patient has a history of diabetes and hypertension as well as sleep apnea.  Patient denies any issues with chronic medical problems.  Past Medical History:  Diagnosis Date  . Diabetes mellitus without complication ([place])   . Hypertension   . Sleep apnea    does not use CPAP    There are no active problems to display for this patient.   Past Surgical History:  Procedure Laterality Date  . TUBAL LIGATION    . UMBILICAL HERNIA REPAIR N/A 08/02/2015   Procedure: HERNIA REPAIR UMBILICAL ADULT;  Surgeon: Christene Lye, MD;  Location: [facility] ORS;  Service: General;  Laterality: N/A;    Prior to Admission medications   Medication Sig Start Date End Date Taking? Authorizing Provider  amLODipine (NORVASC) 5 MG tablet Take 1 tablet (5 mg total) by mouth daily. 07/14/16   Sherwood Gambler, MD  benazepril (LOTENSIN) 40 MG tablet Take 1 tablet (40 mg total) by mouth daily. 07/14/16   Sherwood Gambler, MD  clindamycin (CLEOCIN) 300 MG capsule Take 1 capsule (300 mg total) by mouth 4 (four) times daily. 03/24/17   Malyia Moro, Charline Bills, PA-C  hydrochlorothiazide (HYDRODIURIL) 25 MG  tablet Take 1 tablet (25 mg total) by mouth daily. 07/14/16   Sherwood Gambler, MD  HYDROcodone-acetaminophen (NORCO/VICODIN) 5-325 MG tablet Take 1 tablet by mouth every 4 (four) hours as needed for moderate pain. 03/24/17   Taleigh Gero, Charline Bills, PA-C  Insulin Detemir (LEVEMIR) 100 UNIT/ML Pen Inject 20 Units into the skin 2 (two) times daily. 07/14/16   Sherwood Gambler, MD  metFORMIN (GLUCOPHAGE) 500 MG tablet Take 2 tablets (1,000 mg total) by mouth 2 (two) times daily with a meal. 07/14/16   Sherwood Gambler, MD  naproxen sodium (ANAPROX) 220 MG tablet Take 220 mg by mouth 2 (two) times daily.    [provider]  polyethylene glycol powder (GLYCOLAX/MIRALAX) powder Take 255 g by mouth once. 09/07/15   Christene Lye, MD    Allergies Patient has no known allergies.  Family History  Problem Relation Age of Onset  . Breast cancer Sister 20  . Esophageal cancer Mother   . Colon cancer Father   . Bone cancer Sister     Social History Social History   Tobacco Use  . Smoking status: Never Smoker  . Smokeless tobacco: Never Used  Substance Use Topics  . Alcohol use: No    Alcohol/week: 0.0 oz  . Drug use: Not on file     Review of Systems  Constitutional: No fever/chills Eyes: No visual changes. Cardiovascular: no chest pain. Respiratory: no cough. No SOB. Gastrointestinal: No abdominal pain.  No nausea, no vomiting.  Positive for bloody stools the past 2  days.  No diarrhea.  No constipation. Genitourinary: Negative for dysuria. No hematuria Musculoskeletal: Negative for musculoskeletal pain. Skin: Negative for rash, abrasions, lacerations, ecchymosis. Neurological: Negative for headaches, focal weakness or numbness. 10-point ROS otherwise negative.  ____________________________________________   PHYSICAL EXAM:  VITAL SIGNS: ED Triage Vitals [03/24/17 1741]  Enc Vitals Group     BP (!) 205/96     Pulse Rate (!) 120     Resp 20     Temp (!) 100.5 F (38.1  C)     Temp Source Oral     SpO2 96 %     Weight 270 lb (122.5 kg)     Height 5\' 1"  (1.549 m)     Head Circumference      Peak Flow      Pain Score 10     Pain Loc      Pain Edu?      Excl. in [place]?      Constitutional: Alert and oriented. Well appearing and in no acute distress. Eyes: Conjunctivae are normal. PERRL. EOMI. Head: Atraumatic. ENT:      Ears:       Nose: No congestion/rhinnorhea.      Mouth/Throat: Mucous membranes are moist.  Neck: No stridor.    Cardiovascular: Normal rate, regular rhythm. Normal S1 and S2.  Good peripheral circulation. Respiratory: Normal respiratory effort without tachypnea or retractions. Lungs CTAB. Good air entry to the bases with no decreased or absent breath sounds. Gastrointestinal: Bowel sounds 4 quadrants. Soft and nontender to palpation. No guarding or rigidity. No palpable masses. No distention. No CVA tenderness. Musculoskeletal: Full range of motion to all extremities. No gross deformities appreciated. Neurologic:  Normal speech and language. No gross focal neurologic deficits are appreciated.  Skin:  Skin is warm, dry and intact. No rash noted.  Sensation of the buttocks reveals a significant erythema and edema noted to the left medial buttocks.  This extends into the intergluteal cleft around the anus.  No visible drainage at this time.  Area is very firm to palpation with no fluctuance.  Area is very tender to palpation.  Patient would not stand rectal exam due to pain.  As such, no guaiac testing is performed. Psychiatric: Mood and affect are normal. Speech and behavior are normal. Patient exhibits appropriate insight and judgement.   ____________________________________________   LABS (all labs ordered are listed, but only abnormal results are displayed)  Labs Reviewed  COMPREHENSIVE METABOLIC PANEL - Abnormal; Notable for the following components:      Result Value   Sodium 133 (*)    Potassium 3.4 (*)    Chloride 96 (*)     Glucose, Bld 350 (*)    Calcium 8.8 (*)    Albumin 2.9 (*)    ALT 10 (*)    All other components within normal limits  CBC WITH DIFFERENTIAL/PLATELET - Abnormal; Notable for the following components:   WBC 13.7 (*)    Hemoglobin 9.9 (*)    HCT 30.1 (*)    MCV 75.2 (*)    MCH 24.7 (*)    Neutro Abs 10.6 (*)    All other components within normal limits  URINALYSIS, COMPLETE (UACMP) WITH MICROSCOPIC - Abnormal; Notable for the following components:   Color, Urine YELLOW (*)    APPearance HAZY (*)    Glucose, UA >=500 (*)    Hgb urine dipstick MODERATE (*)    Protein, ur 100 (*)    Bacteria, UA FEW (*)  Squamous Epithelial / LPF 0-5 (*)    All other components within normal limits  CULTURE, BLOOD (ROUTINE X 2)  CULTURE, BLOOD (ROUTINE X 2)  LACTIC ACID, PLASMA  LACTIC ACID, PLASMA  POC URINE PREG, ED  POCT PREGNANCY, URINE   ____________________________________________  EKG   ____________________________________________  RADIOLOGY [facility] [place] Jeff, personally viewed and evaluated these images (plain radiographs) as part of my medical decision making, as well as reviewing the written report by the radiologist.  Per radiology, no appreciable abscess in the buttocks or perirectal region.  Ct Abdomen Pelvis W Contrast  Result Date: 03/24/2017 CLINICAL DATA:  Abscess in area of buttocks noticed 1 week ago. The area has begun to drain. Fever. EXAM: CT ABDOMEN AND PELVIS WITH CONTRAST TECHNIQUE: Multidetector CT imaging of the abdomen and pelvis was performed using the standard protocol following bolus administration of intravenous contrast. CONTRAST:  186mL ISOVUE-300 IOPAMIDOL (ISOVUE-300) INJECTION 61% COMPARISON:  March 22, 2015 FINDINGS: Lower chest: No acute abnormality. Hepatobiliary: No focal liver abnormality is seen. No gallstones, gallbladder wall thickening, or biliary dilatation. Pancreas: Unremarkable. No pancreatic ductal dilatation or surrounding  inflammatory changes. Spleen: A cyst is seen in the superior spleen on series 2, image 21. The spleen is otherwise unremarkable. Adrenals/Urinary Tract: Adrenal glands are normal. There is a mass abutting the upper pole of the left kidney on axial image 32 and coronal image 60 measuring up to 2.1 cm in size. No other renal or perirenal masses are identified. No hydronephrosis. No renal stones. Mild bilateral symmetric perinephric stranding is increased since 2017. No striated nephrogram identified. No ureterectasis or ureteral stones. The bladder is decompressed but grossly unremarkable. The adrenal glands are normal. Stomach/Bowel: The stomach and small bowel are normal. The colon and appendix are normal. Vascular/Lymphatic: No significant vascular findings are present. No enlarged abdominal or pelvic lymph nodes. Reproductive: Uterus and right ovary are normal. The left ovary is not visualized. Other: There is fat stranding in the right buttocks. Multiple foci of air are identified in the medial aspect of the right buttocks consistent with infection. Rounded increased attenuation measuring up to 2.5 cm abuts the skin and may represent the site of drainage. There may be a tiny amount of fluid associated with some of the pockets of air but no other large drainable collection identified. Musculoskeletal: No acute or significant osseous findings. IMPRESSION: 1. Fat stranding and air is seen in the right buttocks, particularly medially. A small amount of fluid is associated with a few of the pockets of air but no large drainable fluid collection identified. The findings are consistent with infection. 2. There is a mass either arising from or immediately adjacent to the superior pole of the right kidney. 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 A small exophytic renal neoplasm is not excluded on this study although  the stability is somewhat reassuring. Recommend comparison to more remote outside CTs if available. If none are available, recommend an MRI for definitive characterization. 3. Bilateral symmetric mild to moderate perinephric stranding with no evidence of obstruction. No striated nephrogram to suggest pyelonephritis. The findings could be due to an underlying nephritis. Electronically Signed   By: Dorise Bullion III M.D   On: 03/24/2017 21:02    ____________________________________________    PROCEDURES  Procedure(s) performed:    Procedures    Medications  iopamidol (ISOVUE-300) 61 % injection 15 mL (not administered)  sodium chloride 0.9 % bolus 1,000 mL (0 mLs Intravenous Stopped 03/24/17 2135)  morphine 4 MG/ML injection 4 mg (4 mg Intravenous Given 03/24/17 1911)  ondansetron (ZOFRAN) injection 4 mg (4 mg Intravenous Given 03/24/17 1911)  iopamidol (ISOVUE-300) 61 % injection 125 mL (125 mLs Intravenous Contrast Given 03/24/17 2033)  clindamycin (CLEOCIN) IVPB 900 mg (900 mg Intravenous New Bag/Given 03/24/17 2135)  sodium chloride 0.9 % bolus 1,000 mL (1,000 mLs Intravenous New Bag/Given 03/24/17 2135)     ____________________________________________   INITIAL IMPRESSION / ASSESSMENT AND PLAN / ED COURSE  Pertinent labs & imaging results that were available during my care of the patient were reviewed by me and considered in my medical decision making (see chart for details).  Review of the [place] CSRS was performed in accordance of the [place] prior to dispensing any controlled drugs.  Clinical Course as of Mar 25 2203  Mon Mar 24, 2017  2120 CT scan returns with no perirectal abscess.  Stranding of the tissue with mild air consistent with infection. CT Abdomen Pelvis W Contrast [JC]  2121 Patient presented to the emergency department complaining of abscess to the left buttocks.  Visualization reveals significant erythema and edema with no appreciable fluctuance.  This extended into the  intergluteal cleft and there was concern for perirectal abscess.  CT scan reveals no perirectal abscess.  At this time, patient will be given IV antibiotics and placed on clindamycin at home.  Patient is given strict precautions to return for any worsening.  At this time, patient is not surgical and is stable for outpatient treatment with antibiotics.  [JC]    Clinical Course User Index [JC] Xaidyn Kepner, Charline Bills, PA-C    Patient's diagnosis is consistent with left buttocks cellulitis.  Patient presented with significant cellulitis to the left buttocks involving the intergluteal cleft.  As this surrounded the rectal region, I was concerned that there may be a deep underlying abscess.  CT scan and blood work returned with no indication of deep underlying abscess.  Elevated white blood cell count consistent with significant cellulitis.  IV clindamycin is given in the emergency department and then patient will be discharged home with pain medication and clindamycin.  Patient is given very strict precautions to return to the emergency department for any worsening of her symptoms.  She verbalizes understanding of same.  Otherwise, patient will follow up with primary care..  Patient is given ED precautions to return to the ED for any worsening or new symptoms.     ____________________________________________  FINAL CLINICAL IMPRESSION(S) / ED DIAGNOSES  Final diagnoses:  Cellulitis of left buttock      NEW MEDICATIONS STARTED DURING THIS VISIT:  ED Discharge Orders        Ordered    clindamycin (CLEOCIN) 300 MG capsule  4 times daily     03/24/17 2205    HYDROcodone-acetaminophen (NORCO/VICODIN) 5-325 MG tablet  Every 4 hours PRN     03/24/17 2205          This chart was dictated using voice recognition software/Dragon. Despite best efforts to proofread, errors can occur which can change the meaning. Any change was purely unintentional.    Darletta Moll, PA-C 03/24/17  2205    Delman Kitten, MD 03/25/17 [phone]

## 2017-03-29 LAB — CULTURE, BLOOD (ROUTINE X 2)
CULTURE: NO GROWTH
Culture: NO GROWTH
SPECIAL REQUESTS: ADEQUATE

## 2017-07-24 ENCOUNTER — Encounter (HOSPITAL_COMMUNITY): Payer: Self-pay | Admitting: *Deleted

## 2017-07-24 ENCOUNTER — Other Ambulatory Visit: Payer: Self-pay

## 2017-07-24 NOTE — Progress Notes (Signed)
Spoke with pt for pre-op call. Pt denies cardiac history or chest pain. Pt is a type 2 diabetic. States she's not had her A1C done since sometime last year and states it was high then. Pt states her fasting blood sugar usually runs in between 200-230. Instructed pt to take 1/2 of her regular dose of Levemir Insulin tonight and in the AM (will take 10 units each time). Instructed pt to check her blood sugar in the AM when she gets up and every 2 hours until she leaves for the hospital. If blood sugar is 70 or below, treat with 1/2 cup of clear juice (apple or cranberry) and recheck blood sugar 15 minutes after drinking juice. If blood sugar continues to be 70 or below, call the Short Stay department and ask to speak to a nurse. Pt voiced understanding.

## 2017-07-25 ENCOUNTER — Encounter (HOSPITAL_COMMUNITY)
Admission: RE | Disposition: A | Discharge status: Home / Self Care | Payer: Self-pay | Source: Ambulatory Visit | Attending: Ophthalmology

## 2017-07-25 ENCOUNTER — Ambulatory Visit (HOSPITAL_COMMUNITY): Payer: BLUE CROSS/BLUE SHIELD | Admitting: Anesthesiology

## 2017-07-25 ENCOUNTER — Encounter (HOSPITAL_COMMUNITY): Payer: Self-pay | Admitting: Surgery

## 2017-07-25 ENCOUNTER — Ambulatory Visit (HOSPITAL_COMMUNITY)
Admission: RE | Admit: 2017-07-25 | Discharge: 2017-07-25 | Disposition: A | Discharge status: Home / Self Care | Payer: BLUE CROSS/BLUE SHIELD | Source: Ambulatory Visit | Attending: Ophthalmology | Admitting: Ophthalmology

## 2017-07-25 DIAGNOSIS — G4730 Sleep apnea, unspecified: Secondary | ICD-10-CM | POA: Diagnosis not present

## 2017-07-25 DIAGNOSIS — Z803 Family history of malignant neoplasm of breast: Secondary | ICD-10-CM | POA: Diagnosis not present

## 2017-07-25 DIAGNOSIS — I493 Ventricular premature depolarization: Secondary | ICD-10-CM | POA: Insufficient documentation

## 2017-07-25 DIAGNOSIS — H33012 Retinal detachment with single break, left eye: Secondary | ICD-10-CM | POA: Diagnosis not present

## 2017-07-25 DIAGNOSIS — Z800 Family history of malignant neoplasm of digestive organs: Secondary | ICD-10-CM | POA: Diagnosis not present

## 2017-07-25 DIAGNOSIS — F419 Anxiety disorder, unspecified: Secondary | ICD-10-CM | POA: Insufficient documentation

## 2017-07-25 DIAGNOSIS — Z6841 Body Mass Index (BMI) 40.0 and over, adult: Secondary | ICD-10-CM | POA: Insufficient documentation

## 2017-07-25 DIAGNOSIS — H4312 Vitreous hemorrhage, left eye: Secondary | ICD-10-CM | POA: Diagnosis present

## 2017-07-25 DIAGNOSIS — I10 Essential (primary) hypertension: Secondary | ICD-10-CM | POA: Insufficient documentation

## 2017-07-25 DIAGNOSIS — E113592 Type 2 diabetes mellitus with proliferative diabetic retinopathy without macular edema, left eye: Secondary | ICD-10-CM | POA: Insufficient documentation

## 2017-07-25 DIAGNOSIS — Z808 Family history of malignant neoplasm of other organs or systems: Secondary | ICD-10-CM | POA: Diagnosis not present

## 2017-07-25 HISTORY — PX: GAS/FLUID EXCHANGE: SHX5334

## 2017-07-25 HISTORY — PX: PHOTOCOAGULATION WITH LASER: SHX6027

## 2017-07-25 HISTORY — DX: Anxiety disorder, unspecified: F41.9

## 2017-07-25 HISTORY — PX: PARS PLANA VITRECTOMY: SHX2166

## 2017-07-25 LAB — BASIC METABOLIC PANEL
Anion gap: 9 (ref 5–15)
BUN: 15 mg/dL (ref 6–20)
CHLORIDE: 101 mmol/L (ref 101–111)
CO2: 28 mmol/L (ref 22–32)
Calcium: 9 mg/dL (ref 8.9–10.3)
Creatinine, Ser: 0.68 mg/dL (ref 0.44–1.00)
GFR calc non Af Amer: >60 mL/min (ref >60)
Glucose, Bld: 252 mg/dL — ABNORMAL HIGH (ref 65–99)
Potassium: 3.8 mmol/L (ref 3.5–5.1)
SODIUM: 138 mmol/L (ref 135–145)

## 2017-07-25 LAB — HEMOGLOBIN A1C
HEMOGLOBIN A1C: 12.6 % — AB (ref 4.8–5.6)
MEAN PLASMA GLUCOSE: 314.92 mg/dL

## 2017-07-25 LAB — GLUCOSE, CAPILLARY
GLUCOSE-CAPILLARY: 184 mg/dL — AB (ref 65–99)
Glucose-Capillary: 248 mg/dL — ABNORMAL HIGH (ref 65–99)

## 2017-07-25 LAB — HEMOGLOBIN: HEMOGLOBIN: 12 g/dL (ref 12.0–15.0)

## 2017-07-25 LAB — POCT PREGNANCY, URINE: PREG TEST UR: NEGATIVE

## 2017-07-25 SURGERY — PARS PLANA VITRECTOMY WITH 25 GAUGE
Anesthesia: General | Site: Eye | Laterality: Left

## 2017-07-25 MED ORDER — HYALURONIDASE HUMAN 150 UNIT/ML IJ SOLN
INTRAMUSCULAR | Status: AC
  Filled 2017-07-25: qty 1

## 2017-07-25 MED ORDER — 0.9 % SODIUM CHLORIDE (POUR BTL) OPTIME
TOPICAL | Status: DC | PRN
  Administered 2017-07-25: 1000 mL

## 2017-07-25 MED ORDER — CYCLOPENTOLATE HCL 1 % OP SOLN
1.0000 [drp] | OPHTHALMIC | Status: AC | PRN
  Administered 2017-07-25 (×3): 1 [drp] via OPHTHALMIC

## 2017-07-25 MED ORDER — DEXAMETHASONE SODIUM PHOSPHATE 10 MG/ML IJ SOLN
INTRAMUSCULAR | Status: DC | PRN
  Administered 2017-07-25: 10 mg

## 2017-07-25 MED ORDER — PHENYLEPHRINE HCL 10 % OP SOLN
1.0000 [drp] | OPHTHALMIC | Status: AC | PRN
  Administered 2017-07-25 (×3): 1 [drp] via OPHTHALMIC

## 2017-07-25 MED ORDER — CEFAZOLIN SUBCONJUNCTIVAL INJECTION 100 MG/0.5 ML
200.0000 mg | INJECTION | SUBCONJUNCTIVAL | Status: AC
  Administered 2017-07-25: 100 mg via SUBCONJUNCTIVAL
  Filled 2017-07-25: qty 2

## 2017-07-25 MED ORDER — ONDANSETRON HCL 4 MG/2ML IJ SOLN: 4.0000 mg | Freq: Once | INTRAMUSCULAR | Status: DC | PRN

## 2017-07-25 MED ORDER — CYCLOPENTOLATE HCL 1 % OP SOLN
OPHTHALMIC | Status: AC
  Administered 2017-07-25: 1 [drp] via OPHTHALMIC
  Filled 2017-07-25: qty 2

## 2017-07-25 MED ORDER — FENTANYL CITRATE (PF) 100 MCG/2ML IJ SOLN
INTRAMUSCULAR | Status: DC | PRN
  Administered 2017-07-25: 50 ug via INTRAVENOUS

## 2017-07-25 MED ORDER — ACETAMINOPHEN 325 MG PO TABS: 325.0000 mg | ORAL_TABLET | ORAL | Status: DC | PRN

## 2017-07-25 MED ORDER — OXYCODONE HCL 5 MG/5ML PO SOLN: 5.0000 mg | Freq: Once | ORAL | Status: DC | PRN

## 2017-07-25 MED ORDER — BSS PLUS IO SOLN
INTRAOCULAR | Status: AC
  Filled 2017-07-25: qty 500

## 2017-07-25 MED ORDER — MIDAZOLAM HCL 5 MG/5ML IJ SOLN
INTRAMUSCULAR | Status: DC | PRN
  Administered 2017-07-25: 2 mg via INTRAVENOUS

## 2017-07-25 MED ORDER — MIDAZOLAM HCL 2 MG/2ML IJ SOLN
INTRAMUSCULAR | Status: AC
  Filled 2017-07-25: qty 2

## 2017-07-25 MED ORDER — LIDOCAINE 2% (20 MG/ML) 5 ML SYRINGE
INTRAMUSCULAR | Status: AC
  Filled 2017-07-25: qty 5

## 2017-07-25 MED ORDER — HYPROMELLOSE (GONIOSCOPIC) 2.5 % OP SOLN
OPHTHALMIC | Status: DC | PRN
  Administered 2017-07-25: 2 [drp] via OPHTHALMIC

## 2017-07-25 MED ORDER — ACETAMINOPHEN 10 MG/ML IV SOLN
INTRAVENOUS | Status: AC
  Administered 2017-07-25: 1000 mg via INTRAVENOUS
  Filled 2017-07-25: qty 100

## 2017-07-25 MED ORDER — METOPROLOL TARTRATE 5 MG/5ML IV SOLN
INTRAVENOUS | Status: DC | PRN
  Administered 2017-07-25 (×2): 2 mg via INTRAVENOUS
  Administered 2017-07-25: 1 mg via INTRAVENOUS

## 2017-07-25 MED ORDER — ACETAMINOPHEN 10 MG/ML IV SOLN: 1000.0000 mg | Freq: Once | INTRAVENOUS | Status: DC

## 2017-07-25 MED ORDER — PROPOFOL 500 MG/50ML IV EMUL
INTRAVENOUS | Status: DC | PRN
  Administered 2017-07-25: 10 ug/kg/min via INTRAVENOUS

## 2017-07-25 MED ORDER — OFLOXACIN 0.3 % OP SOLN
1.0000 [drp] | OPHTHALMIC | Status: AC | PRN
  Administered 2017-07-25 (×3): 1 [drp] via OPHTHALMIC

## 2017-07-25 MED ORDER — PHENYLEPHRINE HCL 2.5 % OP SOLN
1.0000 [drp] | OPHTHALMIC | Status: AC | PRN
  Administered 2017-07-25 (×3): 1 [drp] via OPHTHALMIC

## 2017-07-25 MED ORDER — SOD CITRATE-CITRIC ACID 500-334 MG/5ML PO SOLN: 30.0000 mL | Freq: Once | ORAL | Status: DC

## 2017-07-25 MED ORDER — OXYCODONE HCL 5 MG PO TABS: 5.0000 mg | ORAL_TABLET | Freq: Once | ORAL | Status: DC | PRN

## 2017-07-25 MED ORDER — TOBRAMYCIN-DEXAMETHASONE 0.3-0.1 % OP OINT
TOPICAL_OINTMENT | OPHTHALMIC | Status: AC
  Filled 2017-07-25: qty 3.5

## 2017-07-25 MED ORDER — LIDOCAINE HCL 2 % IJ SOLN
INTRAMUSCULAR | Status: AC
  Filled 2017-07-25: qty 20

## 2017-07-25 MED ORDER — DEXAMETHASONE SODIUM PHOSPHATE 10 MG/ML IJ SOLN
INTRAMUSCULAR | Status: AC
  Filled 2017-07-25: qty 1

## 2017-07-25 MED ORDER — ACETAMINOPHEN 160 MG/5ML PO SOLN: 325.0000 mg | ORAL | Status: DC | PRN

## 2017-07-25 MED ORDER — PROPOFOL 10 MG/ML IV BOLUS
INTRAVENOUS | Status: AC
  Filled 2017-07-25: qty 20

## 2017-07-25 MED ORDER — EPINEPHRINE PF 1 MG/ML IJ SOLN
INTRAMUSCULAR | Status: AC
  Filled 2017-07-25: qty 1

## 2017-07-25 MED ORDER — ONDANSETRON HCL 4 MG/2ML IJ SOLN
INTRAMUSCULAR | Status: DC | PRN
  Administered 2017-07-25: 4 mg via INTRAVENOUS

## 2017-07-25 MED ORDER — LACTATED RINGERS IV SOLN: 100.0000 mL/h | INTRAVENOUS | Status: DC

## 2017-07-25 MED ORDER — FENTANYL CITRATE (PF) 100 MCG/2ML IJ SOLN: 25.0000 ug | INTRAMUSCULAR | Status: DC | PRN

## 2017-07-25 MED ORDER — BUPIVACAINE HCL (PF) 0.75 % IJ SOLN
INTRAMUSCULAR | Status: AC
  Filled 2017-07-25: qty 10

## 2017-07-25 MED ORDER — LIDOCAINE HCL 2 % IJ SOLN
INTRAMUSCULAR | Status: DC | PRN
  Administered 2017-07-25: 6 mL via RETROBULBAR

## 2017-07-25 MED ORDER — ARTIFICIAL TEARS OPHTHALMIC OINT
TOPICAL_OINTMENT | OPHTHALMIC | Status: AC
  Filled 2017-07-25: qty 3.5

## 2017-07-25 MED ORDER — MEPERIDINE HCL 50 MG/ML IJ SOLN: 6.2500 mg | INTRAMUSCULAR | Status: DC | PRN

## 2017-07-25 MED ORDER — STERILE WATER FOR IRRIGATION IR SOLN
Status: DC | PRN
  Administered 2017-07-25: 1000 mL

## 2017-07-25 MED ORDER — SOD CITRATE-CITRIC ACID 500-334 MG/5ML PO SOLN
ORAL | Status: AC
  Filled 2017-07-25: qty 15

## 2017-07-25 MED ORDER — TOBRAMYCIN-DEXAMETHASONE 0.3-0.1 % OP OINT
TOPICAL_OINTMENT | OPHTHALMIC | Status: DC | PRN
  Administered 2017-07-25: 1 via OPHTHALMIC

## 2017-07-25 MED ORDER — PHENYLEPHRINE HCL 2.5 % OP SOLN
OPHTHALMIC | Status: AC
  Administered 2017-07-25: 1 [drp] via OPHTHALMIC
  Filled 2017-07-25: qty 2

## 2017-07-25 MED ORDER — OFLOXACIN 0.3 % OP SOLN
OPHTHALMIC | Status: AC
Start: 2017-07-25 — End: 2017-07-25
  Administered 2017-07-25: 1 [drp] via OPHTHALMIC
  Filled 2017-07-25: qty 5

## 2017-07-25 MED ORDER — ROCURONIUM BROMIDE 10 MG/ML (PF) SYRINGE
PREFILLED_SYRINGE | INTRAVENOUS | Status: AC
  Filled 2017-07-25: qty 5

## 2017-07-25 MED ORDER — PHENYLEPHRINE HCL 10 % OP SOLN
OPHTHALMIC | Status: AC
Start: 2017-07-25 — End: 2017-07-25
  Administered 2017-07-25: 1 [drp] via OPHTHALMIC
  Filled 2017-07-25: qty 5

## 2017-07-25 MED ORDER — ACETAMINOPHEN 10 MG/ML IV SOLN
1000.0000 mg | Freq: Once | INTRAVENOUS | Status: AC
Start: 2017-07-25 — End: 2017-07-25
  Administered 2017-07-25: 1000 mg via INTRAVENOUS

## 2017-07-25 MED ORDER — BSS IO SOLN
INTRAOCULAR | Status: DC | PRN
  Administered 2017-07-25: 15 mL via INTRAOCULAR

## 2017-07-25 MED ORDER — EPINEPHRINE PF 1 MG/ML IJ SOLN
INTRAOCULAR | Status: DC | PRN
  Administered 2017-07-25: 500 mL

## 2017-07-25 MED ORDER — SODIUM CHLORIDE 0.9 % IV SOLN
INTRAVENOUS | Status: DC
  Administered 2017-07-25 (×2): via INTRAVENOUS

## 2017-07-25 MED ORDER — FENTANYL CITRATE (PF) 250 MCG/5ML IJ SOLN
INTRAMUSCULAR | Status: AC
Start: 2017-07-25 — End: ?
  Filled 2017-07-25: qty 5

## 2017-07-25 MED ORDER — HYPROMELLOSE (GONIOSCOPIC) 2.5 % OP SOLN
OPHTHALMIC | Status: AC
  Filled 2017-07-25: qty 15

## 2017-07-25 MED ORDER — ONDANSETRON HCL 4 MG/2ML IJ SOLN
INTRAMUSCULAR | Status: AC
  Filled 2017-07-25: qty 2

## 2017-07-25 SURGICAL SUPPLY — 35 items
CANNULA VLV SOFT TIP 25GA (OPHTHALMIC) ×2 IMPLANT
CLSR STERI-STRIP ANTIMIC 1/2X4 (GAUZE/BANDAGES/DRESSINGS) ×2 IMPLANT
DRAPE HALF SHEET 40X57 (DRAPES) ×2 IMPLANT
DRAPE RETRACTOR (MISCELLANEOUS) ×2 IMPLANT
GAS AUTO FILL CONSTEL (OPHTHALMIC) ×2
GAS AUTO FILL CONSTELLATION (OPHTHALMIC) ×1 IMPLANT
GLOVE BIOGEL PI IND STRL 7.0 (GLOVE) ×1 IMPLANT
GLOVE BIOGEL PI INDICATOR 7.0 (GLOVE) ×1
GLOVE ECLIPSE 7.5 STRL STRAW (GLOVE) ×4 IMPLANT
GLOVE SURG SS PI 6.5 STRL IVOR (GLOVE) ×4 IMPLANT
GLOVE SURG SS PI 7.0 STRL IVOR (GLOVE) ×2 IMPLANT
GOWN STRL REUS W/ TWL LRG LVL3 (GOWN DISPOSABLE) ×1 IMPLANT
GOWN STRL REUS W/TWL LRG LVL3 (GOWN DISPOSABLE) ×1
KIT BASIN OR (CUSTOM PROCEDURE TRAY) ×2 IMPLANT
KIT TURNOVER KIT B (KITS) ×2 IMPLANT
LENS BIOM SUPER VIEW SET DISP (OPHTHALMIC RELATED) ×2 IMPLANT
NEEDLE 18GX1X1/2 (RX/OR ONLY) (NEEDLE) ×2 IMPLANT
NEEDLE 25GX 5/8IN NON SAFETY (NEEDLE) ×2 IMPLANT
NEEDLE FILTER BLUNT 18X 1/2SAF (NEEDLE) ×1
NEEDLE FILTER BLUNT 18X1 1/2 (NEEDLE) ×1 IMPLANT
NEEDLE HYPO 25GX1X1/2 BEV (NEEDLE) ×2 IMPLANT
NEEDLE HYPO 30X.5 LL (NEEDLE) ×4 IMPLANT
NEEDLE RETROBULBAR 25GX1.5 (NEEDLE) ×2 IMPLANT
NS IRRIG 1000ML POUR BTL (IV SOLUTION) ×2 IMPLANT
PACK VITRECTOMY CUSTOM (CUSTOM PROCEDURE TRAY) ×2 IMPLANT
PAD ARMBOARD 7.5X6 YLW CONV (MISCELLANEOUS) ×4 IMPLANT
PAK PIK VITRECTOMY CVS 25GA (OPHTHALMIC) ×2 IMPLANT
PROBE ENDO DIATHERMY 25G (MISCELLANEOUS) ×2 IMPLANT
PROBE LASER ILLUM FLEX CVD 25G (OPHTHALMIC) ×2 IMPLANT
SOLUTION ANTI FOG 6CC (MISCELLANEOUS) ×2 IMPLANT
SUT VICRYL 7 0 TG140 8 (SUTURE) ×2 IMPLANT
SYR 20CC LL (SYRINGE) ×2 IMPLANT
SYR 5ML LL (SYRINGE) ×2 IMPLANT
SYR TB 1ML LUER SLIP (SYRINGE) ×2 IMPLANT
WATER STERILE IRR 1000ML POUR (IV SOLUTION) ×2 IMPLANT

## 2017-07-25 NOTE — Anesthesia Procedure Notes (Signed)
Procedure Name: MAC Date/Time: 07/25/2017 2:05 PM Performed by: Oletta Lamas, CRNA Pre-anesthesia Checklist: Patient identified, Emergency Drugs available, Suction available and Patient being monitored Patient Re-evaluated:Patient Re-evaluated prior to induction Oxygen Delivery Method: Nasal cannula

## 2017-07-25 NOTE — Anesthesia Postprocedure Evaluation (Signed)
Anesthesia Post Note  Patient: Kathryn Hogan  Procedure(s) Performed: PARS PLANA VITRECTOMY WITH 25 GAUGE LEFT EYE (Left Eye) PHOTOCOAGULATION WITH LASER (Left Eye) GAS/FLUID EXCHANGE (Left Eye)     Patient location during evaluation: PACU Anesthesia Type: General Level of consciousness: awake and alert Pain management: pain level controlled Vital Signs Assessment: post-procedure vital signs reviewed and stable Respiratory status: spontaneous breathing, nonlabored ventilation, respiratory function stable and patient connected to nasal cannula oxygen Cardiovascular status: blood pressure returned to baseline and stable Postop Assessment: no apparent nausea or vomiting Anesthetic complications: no    Last Vitals:  Vitals:   07/25/17 1525 07/25/17 1540  BP: (!) 159/96 (!) 141/88  Pulse: 86 80  Resp: 15 15  Temp:  (!) 36.4 C  SpO2: 95% 100%    Last Pain:  Vitals:   07/25/17 1540  TempSrc:   PainSc: 6                  Selso Mannor

## 2017-07-25 NOTE — Transfer of Care (Signed)
Immediate Anesthesia Transfer of Care Note  Patient: Kathryn Hogan  Procedure(s) Performed: PARS PLANA VITRECTOMY WITH 25 GAUGE LEFT EYE (Left Eye) PHOTOCOAGULATION WITH LASER (Left Eye) GAS/FLUID EXCHANGE (Left Eye)  Patient Location: PACU  Anesthesia Type:MAC  Level of Consciousness: awake, alert , oriented and patient cooperative  Airway & Oxygen Therapy: Patient Spontanous Breathing  Post-op Assessment: Report given to RN and Post -op Vital signs reviewed and stable  Post vital signs: Reviewed and stable  Last Vitals:  Vitals Value Taken Time  BP 159/96 07/25/2017  3:23 PM  Temp    Pulse 83 07/25/2017  3:27 PM  Resp 13 07/25/2017  3:27 PM  SpO2 99 % 07/25/2017  3:27 PM  Vitals shown include unvalidated device data.  Last Pain:  Vitals:   07/25/17 1510  TempSrc:   PainSc: Asleep      Patients Stated Pain Goal: 3 (94/58/59 2924)  Complications: No apparent anesthesia complications

## 2017-07-25 NOTE — Anesthesia Preprocedure Evaluation (Addendum)
Anesthesia Evaluation  Patient identified by MRN, date of birth, ID band Patient awake    Reviewed: Allergy & Precautions, H&P , NPO status , Patient's Chart, lab work & pertinent test results  Airway Mallampati: III  TM Distance: <3 FB Neck ROM: full    Dental  (+) Poor Dentition, Missing, Edentulous Lower, Edentulous Upper   Pulmonary neg shortness of breath, sleep apnea ,    Pulmonary exam normal breath sounds clear to auscultation       Cardiovascular Exercise Tolerance: Good hypertension, (-) angina(-) Past MI and (-) DOE Normal cardiovascular exam Rhythm:regular Rate:Normal     Neuro/Psych negative neurological ROS  negative psych ROS   GI/Hepatic negative GI ROS, Neg liver ROS,   Endo/Other  diabetes, Type 2, Insulin DependentMorbid obesity  Renal/GU negative Renal ROS  negative genitourinary   Musculoskeletal   Abdominal   Peds  Hematology negative hematology ROS (+)   Anesthesia Other Findings Past Medical History:   Diabetes mellitus without complication         Hypertension                                                 Sleep apnea                                                    Comment:does not use CPAP       Reproductive/Obstetrics negative OB ROS                             Anesthesia Physical  Anesthesia Plan  ASA: III  Anesthesia Plan: General ETT and MAC   Post-op Pain Management:    Induction: Intravenous  PONV Risk Score and Plan: 2 and Ondansetron and Treatment may vary due to age or medical condition  Airway Management Planned: Nasal Cannula and Natural Airway  Additional Equipment:   Intra-op Plan:   Post-operative Plan:   Informed Consent: I have reviewed the patients History and Physical, chart, labs and discussed the procedure including the risks, benefits and alternatives for the proposed anesthesia with the patient or authorized  representative who has indicated his/her understanding and acceptance.   Dental Advisory Given  Plan Discussed with: Anesthesiologist, CRNA and Surgeon  Anesthesia Plan Comments: (  )       Anesthesia Quick Evaluation

## 2017-07-25 NOTE — H&P (Signed)
  Date of examination:  07/24/07  Indication for surgery: vitreous hemorrhage left eye  Pertinent past medical history:  Past Medical History:  Diagnosis Date  . Anxiety   . Diabetes mellitus without complication ([place])   . Hypertension   . 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 Breast cancer Sister 62  . Esophageal cancer Mother   . Colon cancer Father   . Bone cancer Sister     General:  Healthy appearing patient in no distress.    Eyes:    Acuity OD CF  OS 20/800  [place]  External: Within normal limits     Anterior segment: Within normal limits      Fundus: No view - B-scan - retina attached, vitrous hemorrhage    Impression: proliferative diabetic retinopathy with vitreous hemorrhage left eye  Plan: pars plana vitrectomy with endolaser left eye  Royston Cowper

## 2017-07-25 NOTE — Discharge Instructions (Addendum)
DO NOT SLEEP ON BACK, THE EYE PRESSURE CAN GO UP AND CAUSE VISION LOSS   SLEEP ON SIDE WITH NOSE TO PILLOW  DURING DAY KEEP FACE DOWN.  15 MINUTES EVERY 2 HOURS IT IS OK TO LOOK STRAIGHT AHEAD (USE BATHROOM, EAT, WALK, ETC.)  

## 2017-07-26 ENCOUNTER — Encounter (HOSPITAL_COMMUNITY): Payer: Self-pay | Admitting: Ophthalmology

## 2017-07-26 NOTE — Brief Op Note (Signed)
07/25/2017  4:10 PM  PATIENT:  Kathryn Hogan  47 y.o. female  PRE-OPERATIVE DIAGNOSIS:  vitreous hemorrhage left eye  POST-OPERATIVE DIAGNOSIS:  Localized retinal detachment with vitreous hemorrhage left eye  PROCEDURE:  Procedure(s) with comments: Retinal detachment repair with PARS PLANA VITRECTOMY WITH 25 GAUGE LEFT EYE (Left) PHOTOCOAGULATION WITH LASER (Left), ENDOCAUTERY, DRAINAGE of SUBRETINAL FLUID, and GAS/FLUID EXCHANGE (Left) - SF6  SURGEON:  Surgeon(s) and Role:    * Jalene Mullet, MD - Primary  PHYSICIAN ASSISTANT:   ASSISTANTS: none   ANESTHESIA:   local and regional  EBL:  minimal   BLOOD ADMINISTERED:none  DRAINS: none   LOCAL MEDICATIONS USED:  MARCAINE    and LIDOCAINE   SPECIMEN:  No Specimen  DISPOSITION OF SPECIMEN:  N/A  COUNTS:  YES  TOURNIQUET:  * No tourniquets in log *  DICTATION: .Note written in EPIC  PLAN OF CARE: Discharge to home after PACU  PATIENT DISPOSITION:  PACU - hemodynamically stable.   Delay start of Pharmacological VTE agent (>24hrs) due to surgical blood loss or risk of bleeding: not applicable

## 2017-07-26 NOTE — Op Note (Signed)
Remell F Beringer 07/26/2017 Diagnosis: Vitreous hemorrhage and retinal detachment left eye   PATIENT:  Kathryn Hogan  47 y.o. 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  A lid speculum was placed.  Infusion line and trocar was placed at the 4 o'clock position approximately 3.5 mm from the surgical limbus.   The infusion line was allowed to run and then clamped when placed at the cannula opening. The line was inserted and secured to the drape with an adhesive strip.   Active trocars/cannula were placed at the 10 and 2 o'clock positions approximately 3.5 mm from the surgical limbus. The cannula was visualized in the vitreous cavity.  The light pipe and vitreous cutter were inserted into the vitreous cavity and a vitrector was used to remove the vitreous hemorrhage.   A localized retinal detachment was noted in the temporal macula.  Endocautery was used to make a retinotomy in the temporal macula.  A complete air/fluid exchange was performed.  Subretinal fluid was drained with silicone soft tipped cannula.  Endolaser was applied surrounding the retinotomy and the peripheral retina.  14% SF6 gas was injected in the eye.   The superior cannulas were sequentially removed with concommitant tamponade using a cotton tipped applicator and noted to be air tight.  The infusion line and trocar were removed and the sclerotomy was noted to be air tight  with normal intraocular pressure by digital palpapation.  Subconjunctival injections of Ancef and Decadron were placed.  The speculum and drapes were removed and the eye was patched with Polymixin/Bacitracin ophthalmic ointment. An eye shield was placed and the patient was transferred alert and conversant with stable vital signs to the post operative recovery area.  The patient tolerated the procedure well and no complications were noted.  Royston Cowper MD

## 2017-08-14 ENCOUNTER — Encounter (HOSPITAL_COMMUNITY): Payer: Self-pay | Admitting: *Deleted

## 2017-08-14 ENCOUNTER — Other Ambulatory Visit: Payer: Self-pay

## 2017-08-14 NOTE — Progress Notes (Addendum)
I spoke to Kathryn Hogan, who denies chest pain or shortness of breath.  I asked patient if her MD instructed her to stop  Amlodipine and Metformin.  Patient said she ran out of medications and she needs to see her MD to get another prescription.  I informed patient that A1C was very high when she was here earlier in June.  Patient reports that her CBG was 200 this am and that it has been  130- 250 recently.  I informed patient that anesthesia can cancel surgery if blood glucose is high on the morning of surgery. Kathryn Hogan does not see a Musician, she is seen at [facility] in [place]. I instructed patient to check CBG after awaking and every 2 hours until arrival  to the hospital.  I Instructed patient if CBG is less than 70 to drink 1/2 cup of a clear juice. Recheck CBG in 15 minutes then call pre- op desk at [phone] for further instructions. If scheduled to receive Insulin, do not take Insulin. Patient asked if she could drink orange juice, I said no and gave her examples of clear juices.  I informed Kathryn Gianotti, PA of A1C and above information.

## 2017-08-14 NOTE — Progress Notes (Signed)
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 She takes Levemir 20units BID, she reports FSBS 130-250mg /dl - reports FSBS this AM 200mg /dl. She underwent similar procedure on contralateral eye by Dr. Dareen Piano on 07/25/2017.  PAT RN advised pt that her surgery could be delayed or cancelled if she arrives with significantly elevated blood sugar.  She will have labs and vitals measured on arrival of DOS. Anticipate she will be able to proceed with surgery as long as values not significantly our of range.  VS: to be done DOS  PROVIDERS: [facility], [place]: To be done DOS A1C measured 07/25/2017: 12.6   IMAGES: N/A   EKG: Done 07/25/2017: Sinus tachycardia with occasional premature ventricular complexes.  Possible left atrial enlargement.  No significant change since last tracing 03/21/2015   CV: N/A  Past Medical History:  Diagnosis Date  . Anxiety   . Diabetes mellitus without complication ([address])   . Hypertension   . Sleep apnea    does not use CPAP    Past Surgical History:  Procedure Laterality Date  . GAS/FLUID EXCHANGE Left 07/25/2017   Procedure: GAS/FLUID EXCHANGE;  Surgeon: Jalene Mullet, MD;  Location: [place];  Service: Ophthalmology;  Laterality: Left;  SF6  . PARS PLANA VITRECTOMY Left 07/25/2017   Procedure: PARS PLANA VITRECTOMY WITH 25 GAUGE LEFT EYE;  Surgeon: Jalene Mullet, MD;  Location: [place];  Service: Ophthalmology;  Laterality: Left;  .  PHOTOCOAGULATION WITH LASER Left 07/25/2017   Procedure: PHOTOCOAGULATION WITH LASER;  Surgeon: Jalene Mullet, MD;  Location: [place];  Service: Ophthalmology;  Laterality: Left;  . TUBAL LIGATION    . UMBILICAL HERNIA REPAIR N/A 08/02/2015   Procedure: HERNIA REPAIR UMBILICAL ADULT;  Surgeon: Christene Lye, MD;  Location: [facility];  Service: General;  Laterality: N/A;    MEDICATIONS: No current facility-administered medications for this encounter.    Marland Kitchen acetaminophen (TYLENOL) 500 MG tablet  . amLODipine (NORVASC) 5 MG tablet  . benazepril (LOTENSIN) 40 MG tablet  . clindamycin (CLEOCIN) 300 MG capsule  . hydrochlorothiazide (HYDRODIURIL) 25 MG tablet  . HYDROcodone-acetaminophen (NORCO/VICODIN) 5-325 MG tablet  . Insulin Detemir (LEVEMIR) 100 UNIT/ML Pen  . metFORMIN (GLUCOPHAGE) 500 MG tablet  . polyethylene glycol powder (GLYCOLAX/MIRALAX) powder    Wynonia Musty [facility] Short Stay Center/Anesthesiology Phone [phone] 08/14/2017 11:36 AM

## 2017-08-15 ENCOUNTER — Encounter (HOSPITAL_COMMUNITY): Payer: Self-pay | Admitting: *Deleted

## 2017-08-15 ENCOUNTER — Ambulatory Visit (HOSPITAL_COMMUNITY): Payer: BLUE CROSS/BLUE SHIELD | Admitting: Physician Assistant

## 2017-08-15 ENCOUNTER — Encounter (HOSPITAL_COMMUNITY)
Admission: RE | Disposition: A | Discharge status: Home / Self Care | Payer: Self-pay | Source: Ambulatory Visit | Attending: Ophthalmology

## 2017-08-15 ENCOUNTER — Ambulatory Visit (HOSPITAL_COMMUNITY)
Admission: RE | Admit: 2017-08-15 | Discharge: 2017-08-15 | Disposition: A | Discharge status: Home / Self Care | Payer: BLUE CROSS/BLUE SHIELD | Source: Ambulatory Visit | Attending: Ophthalmology | Admitting: Ophthalmology

## 2017-08-15 ENCOUNTER — Ambulatory Visit: Payer: Self-pay | Admitting: Ophthalmology

## 2017-08-15 DIAGNOSIS — G4730 Sleep apnea, unspecified: Secondary | ICD-10-CM | POA: Insufficient documentation

## 2017-08-15 DIAGNOSIS — Z794 Long term (current) use of insulin: Secondary | ICD-10-CM | POA: Insufficient documentation

## 2017-08-15 DIAGNOSIS — E113541 Type 2 diabetes mellitus with proliferative diabetic retinopathy with combined traction retinal detachment and rhegmatogenous retinal detachment, right eye: Secondary | ICD-10-CM | POA: Diagnosis not present

## 2017-08-15 HISTORY — PX: PARS PLANA VITRECTOMY: SHX2166

## 2017-08-15 LAB — BASIC METABOLIC PANEL
Anion gap: 11 (ref 5–15)
BUN: 11 mg/dL (ref 6–20)
CHLORIDE: 100 mmol/L (ref 98–111)
CO2: 26 mmol/L (ref 22–32)
Calcium: 8.9 mg/dL (ref 8.9–10.3)
Creatinine, Ser: 0.58 mg/dL (ref 0.44–1.00)
GFR calc Af Amer: >60 mL/min (ref >60)
GFR calc non Af Amer: >60 mL/min (ref >60)
Glucose, Bld: 225 mg/dL — ABNORMAL HIGH (ref 70–99)
POTASSIUM: 3.7 mmol/L (ref 3.5–5.1)
SODIUM: 137 mmol/L (ref 135–145)

## 2017-08-15 LAB — CBC
HEMATOCRIT: 40 % (ref 36.0–46.0)
HEMOGLOBIN: 12.4 g/dL (ref 12.0–15.0)
MCH: 24.4 pg — ABNORMAL LOW (ref 26.0–34.0)
MCHC: 31 g/dL (ref 30.0–36.0)
MCV: 78.7 fL (ref 78.0–100.0)
Platelets: 241 10*3/uL (ref 150–400)
RBC: 5.08 MIL/uL (ref 3.87–5.11)
RDW: 13.6 % (ref 11.5–15.5)
WBC: 8.5 10*3/uL (ref 4.0–10.5)

## 2017-08-15 LAB — GLUCOSE, CAPILLARY
GLUCOSE-CAPILLARY: 234 mg/dL — AB (ref 70–99)
Glucose-Capillary: 230 mg/dL — ABNORMAL HIGH (ref 70–99)
Glucose-Capillary: 199 mg/dL — ABNORMAL HIGH (ref 70–99)

## 2017-08-15 LAB — POCT PREGNANCY, URINE: Preg Test, Ur: NEGATIVE

## 2017-08-15 SURGERY — PARS PLANA VITRECTOMY WITH 25 GAUGE
Anesthesia: Monitor Anesthesia Care | Site: Eye | Laterality: Right

## 2017-08-15 MED ORDER — MIDAZOLAM HCL 5 MG/5ML IJ SOLN
INTRAMUSCULAR | Status: DC | PRN
  Administered 2017-08-15: 2 mg via INTRAVENOUS

## 2017-08-15 MED ORDER — LIDOCAINE HCL 2 % IJ SOLN
INTRAMUSCULAR | Status: AC
  Filled 2017-08-15: qty 20

## 2017-08-15 MED ORDER — LABETALOL HCL 5 MG/ML IV SOLN
INTRAVENOUS | Status: AC
  Filled 2017-08-15: qty 4

## 2017-08-15 MED ORDER — LABETALOL HCL 5 MG/ML IV SOLN
20.0000 mg | INTRAVENOUS | Status: DC | PRN
  Administered 2017-08-15 (×4): 5 mg via INTRAVENOUS
  Filled 2017-08-15: qty 4

## 2017-08-15 MED ORDER — BSS IO SOLN
INTRAOCULAR | Status: AC
  Filled 2017-08-15: qty 15

## 2017-08-15 MED ORDER — PHENYLEPHRINE HCL 10 % OP SOLN
1.0000 [drp] | OPHTHALMIC | Status: AC | PRN
  Administered 2017-08-15: 1 [drp] via OPHTHALMIC
  Filled 2017-08-15: qty 5

## 2017-08-15 MED ORDER — TROPICAMIDE 1 % OP SOLN
1.0000 [drp] | OPHTHALMIC | Status: AC | PRN
  Administered 2017-08-15 (×3): 1 [drp] via OPHTHALMIC
  Filled 2017-08-15: qty 15

## 2017-08-15 MED ORDER — FENTANYL CITRATE (PF) 100 MCG/2ML IJ SOLN
INTRAMUSCULAR | Status: AC
  Filled 2017-08-15: qty 2

## 2017-08-15 MED ORDER — ONDANSETRON HCL 4 MG/2ML IJ SOLN
INTRAMUSCULAR | Status: DC | PRN
  Administered 2017-08-15: 4 mg via INTRAVENOUS

## 2017-08-15 MED ORDER — HYPROMELLOSE (GONIOSCOPIC) 2.5 % OP SOLN
OPHTHALMIC | Status: DC | PRN
  Administered 2017-08-15: 10 [drp] via OPHTHALMIC

## 2017-08-15 MED ORDER — BSS IO SOLN
INTRAOCULAR | Status: DC | PRN
  Administered 2017-08-15: 15 mL via INTRAOCULAR

## 2017-08-15 MED ORDER — MEPERIDINE HCL 50 MG/ML IJ SOLN: 6.2500 mg | INTRAMUSCULAR | Status: DC | PRN

## 2017-08-15 MED ORDER — LIDOCAINE 2% (20 MG/ML) 5 ML SYRINGE
INTRAMUSCULAR | Status: AC
  Filled 2017-08-15: qty 5

## 2017-08-15 MED ORDER — HYPROMELLOSE (GONIOSCOPIC) 2.5 % OP SOLN
OPHTHALMIC | Status: AC
  Filled 2017-08-15: qty 15

## 2017-08-15 MED ORDER — TOBRAMYCIN-DEXAMETHASONE 0.3-0.1 % OP OINT
TOPICAL_OINTMENT | OPHTHALMIC | Status: AC
  Filled 2017-08-15: qty 3.5

## 2017-08-15 MED ORDER — METOCLOPRAMIDE HCL 5 MG/ML IJ SOLN: 10.0000 mg | Freq: Once | INTRAMUSCULAR | Status: DC | PRN

## 2017-08-15 MED ORDER — TOBRAMYCIN 0.3 % OP OINT
TOPICAL_OINTMENT | OPHTHALMIC | Status: DC | PRN
  Administered 2017-08-15: 1 via OPHTHALMIC

## 2017-08-15 MED ORDER — HYDROCODONE-ACETAMINOPHEN 7.5-325 MG PO TABS
1.0000 | ORAL_TABLET | Freq: Once | ORAL | Status: AC | PRN
  Administered 2017-08-15: 1 via ORAL

## 2017-08-15 MED ORDER — OFLOXACIN 0.3 % OP SOLN
1.0000 [drp] | OPHTHALMIC | Status: AC | PRN
  Administered 2017-08-15 (×3): 1 [drp] via OPHTHALMIC
  Filled 2017-08-15: qty 5

## 2017-08-15 MED ORDER — LIDOCAINE HCL (CARDIAC) PF 100 MG/5ML IV SOSY
PREFILLED_SYRINGE | INTRAVENOUS | Status: DC | PRN
  Administered 2017-08-15: 50 mg via INTRAVENOUS

## 2017-08-15 MED ORDER — HYDROCODONE-ACETAMINOPHEN 7.5-325 MG PO TABS
ORAL_TABLET | ORAL | Status: AC
  Filled 2017-08-15: qty 1

## 2017-08-15 MED ORDER — ROCURONIUM BROMIDE 50 MG/5ML IV SOLN
INTRAVENOUS | Status: AC
  Filled 2017-08-15: qty 1

## 2017-08-15 MED ORDER — ATROPINE SULFATE 1 % OP SOLN
OPHTHALMIC | Status: AC
  Filled 2017-08-15: qty 5

## 2017-08-15 MED ORDER — TETRACAINE HCL 0.5 % OP SOLN
OPHTHALMIC | Status: AC
Start: 2017-08-15 — End: ?
  Filled 2017-08-15: qty 4

## 2017-08-15 MED ORDER — EPINEPHRINE PF 1 MG/ML IJ SOLN
INTRAMUSCULAR | Status: AC
  Filled 2017-08-15: qty 1

## 2017-08-15 MED ORDER — PROPOFOL 10 MG/ML IV BOLUS
INTRAVENOUS | Status: DC | PRN
  Administered 2017-08-15: 20 mg via INTRAVENOUS
  Administered 2017-08-15: 40 mg via INTRAVENOUS

## 2017-08-15 MED ORDER — LACTATED RINGERS IV SOLN
INTRAVENOUS | Status: DC
  Administered 2017-08-15: 13:00:00 via INTRAVENOUS

## 2017-08-15 MED ORDER — FENTANYL CITRATE (PF) 100 MCG/2ML IJ SOLN
25.0000 ug | INTRAMUSCULAR | Status: DC | PRN
  Administered 2017-08-15: 50 ug via INTRAVENOUS

## 2017-08-15 MED ORDER — FENTANYL CITRATE (PF) 250 MCG/5ML IJ SOLN
INTRAMUSCULAR | Status: AC
  Filled 2017-08-15: qty 5

## 2017-08-15 MED ORDER — BUPIVACAINE HCL (PF) 0.75 % IJ SOLN
INTRAMUSCULAR | Status: AC
  Filled 2017-08-15: qty 10

## 2017-08-15 MED ORDER — BSS PLUS IO SOLN
INTRAOCULAR | Status: AC
  Filled 2017-08-15: qty 500

## 2017-08-15 MED ORDER — LIDOCAINE HCL 2 % IJ SOLN
INTRAMUSCULAR | Status: DC | PRN
  Administered 2017-08-15: 5 mL via RETROBULBAR

## 2017-08-15 MED ORDER — MIDAZOLAM HCL 2 MG/2ML IJ SOLN
INTRAMUSCULAR | Status: AC
  Filled 2017-08-15: qty 2

## 2017-08-15 MED ORDER — LABETALOL HCL 5 MG/ML IV SOLN
INTRAVENOUS | Status: AC
  Administered 2017-08-15 (×2): 5 mg via INTRAVENOUS
  Filled 2017-08-15: qty 4

## 2017-08-15 MED ORDER — PHENYLEPHRINE HCL 2.5 % OP SOLN
1.0000 [drp] | OPHTHALMIC | Status: AC | PRN
  Administered 2017-08-15 (×3): 1 [drp] via OPHTHALMIC
  Filled 2017-08-15: qty 2

## 2017-08-15 MED ORDER — BSS PLUS IO SOLN
INTRAOCULAR | Status: DC | PRN
  Administered 2017-08-15: 16:00:00

## 2017-08-15 MED ORDER — FENTANYL CITRATE (PF) 100 MCG/2ML IJ SOLN
INTRAMUSCULAR | Status: DC | PRN
  Administered 2017-08-15 (×3): 25 ug via INTRAVENOUS

## 2017-08-15 MED ORDER — DEXAMETHASONE SODIUM PHOSPHATE 10 MG/ML IJ SOLN
INTRAMUSCULAR | Status: AC
  Filled 2017-08-15: qty 1

## 2017-08-15 MED ORDER — LABETALOL HCL 5 MG/ML IV SOLN
INTRAVENOUS | Status: DC | PRN
  Administered 2017-08-15 (×2): 5 mg via INTRAVENOUS

## 2017-08-15 MED ORDER — CEFAZOLIN SUBCONJUNCTIVAL INJECTION 100 MG/0.5 ML
100.0000 mg | INJECTION | SUBCONJUNCTIVAL | Status: DC
  Filled 2017-08-15: qty 5

## 2017-08-15 MED ORDER — HYALURONIDASE HUMAN 150 UNIT/ML IJ SOLN
INTRAMUSCULAR | Status: AC
  Filled 2017-08-15: qty 1

## 2017-08-15 SURGICAL SUPPLY — 55 items
APPLICATOR COTTON TIP 6IN STRL (MISCELLANEOUS) ×3 IMPLANT
BLADE MVR KNIFE 20G (BLADE) IMPLANT
BLADE STAB KNIFE 15DEG (BLADE) IMPLANT
CANNULA ANT CHAM MAIN (OPHTHALMIC RELATED) IMPLANT
CANNULA DUAL BORE 23G (CANNULA) IMPLANT
CANNULA DUALBORE 25G (CANNULA) IMPLANT
CANNULA VLV SOFT TIP 25GA (OPHTHALMIC) ×3 IMPLANT
CAUTERY EYE LOW TEMP 1300F FIN (OPHTHALMIC RELATED) IMPLANT
CLOSURE STERI-STRIP 1/2X4 (GAUZE/BANDAGES/DRESSINGS) ×1
CLSR STERI-STRIP ANTIMIC 1/2X4 (GAUZE/BANDAGES/DRESSINGS) ×2 IMPLANT
COVER MAYO STAND STRL (DRAPES) IMPLANT
DRAPE HALF SHEET 40X57 (DRAPES) IMPLANT
DRAPE INCISE 51X51 W/FILM STRL (DRAPES) IMPLANT
DRAPE RETRACTOR (MISCELLANEOUS) ×3 IMPLANT
ERASER HMR WETFIELD 23G BP (MISCELLANEOUS) IMPLANT
FORCEPS ECKARDT ILM 25G SERR (OPHTHALMIC RELATED) IMPLANT
FORCEPS GRIESHABER ILM 25G A (INSTRUMENTS) ×3 IMPLANT
GAS AUTO FILL CONSTEL (OPHTHALMIC) ×3
GAS AUTO FILL CONSTELLATION (OPHTHALMIC) ×1 IMPLANT
GLOVE ECLIPSE 7.5 STRL STRAW (GLOVE) ×3 IMPLANT
GOWN STRL REUS W/ TWL LRG LVL3 (GOWN DISPOSABLE) ×1 IMPLANT
GOWN STRL REUS W/TWL LRG LVL3 (GOWN DISPOSABLE) ×2
KIT BASIN OR (CUSTOM PROCEDURE TRAY) ×3 IMPLANT
KIT TURNOVER KIT B (KITS) ×3 IMPLANT
LENS BIOM SUPER VIEW SET DISP (OPHTHALMIC RELATED) ×3 IMPLANT
MICROPICK 25G (MISCELLANEOUS)
NEEDLE 18GX1X1/2 (RX/OR ONLY) (NEEDLE) ×3 IMPLANT
NEEDLE 25GX 5/8IN NON SAFETY (NEEDLE) ×3 IMPLANT
NEEDLE FILTER BLUNT 18X 1/2SAF (NEEDLE) ×2
NEEDLE FILTER BLUNT 18X1 1/2 (NEEDLE) ×1 IMPLANT
NEEDLE HYPO 25GX1X1/2 BEV (NEEDLE) IMPLANT
NEEDLE HYPO 30X.5 LL (NEEDLE) ×6 IMPLANT
NEEDLE RETROBULBAR 25GX1.5 (NEEDLE) ×3 IMPLANT
NS IRRIG 1000ML POUR BTL (IV SOLUTION) ×3 IMPLANT
OIL SILICONE OPHTHALMIC ADAPTO (Ophthalmic Related) ×3 IMPLANT
PACK FRAGMATOME (OPHTHALMIC) IMPLANT
PACK VITRECTOMY CUSTOM (CUSTOM PROCEDURE TRAY) ×3 IMPLANT
PAD ARMBOARD 7.5X6 YLW CONV (MISCELLANEOUS) ×3 IMPLANT
PAK PIK VITRECTOMY CVS 25GA (OPHTHALMIC) ×3 IMPLANT
PENCIL BIPOLAR 25GA STR DISP (OPHTHALMIC RELATED) ×3 IMPLANT
PICK MICROPICK 25G (MISCELLANEOUS) IMPLANT
PROBE LASER ILLUM FLEX CVD 25G (OPHTHALMIC) ×3 IMPLANT
ROLLS DENTAL (MISCELLANEOUS) IMPLANT
SCRAPER DIAMOND 25GA (OPHTHALMIC RELATED) IMPLANT
SET INJECTOR OIL FLUID CONSTEL (OPHTHALMIC) ×3 IMPLANT
SOLUTION ANTI FOG 6CC (MISCELLANEOUS) ×3 IMPLANT
STOPCOCK 4 WAY LG BORE MALE ST (IV SETS) IMPLANT
SUT VICRYL 7 0 TG140 8 (SUTURE) IMPLANT
SUT VICRYL 8 0 TG140 8 (SUTURE) IMPLANT
SYR 10ML LL (SYRINGE) IMPLANT
SYR 20CC LL (SYRINGE) ×3 IMPLANT
SYR 5ML LL (SYRINGE) ×3 IMPLANT
SYR TB 1ML LUER SLIP (SYRINGE) IMPLANT
WATER STERILE IRR 1000ML POUR (IV SOLUTION) ×3 IMPLANT
WIPE INSTRUMENT VISIWIPE 73X73 (MISCELLANEOUS) IMPLANT

## 2017-08-15 NOTE — Anesthesia Preprocedure Evaluation (Addendum)
Anesthesia Evaluation  Patient identified by MRN, date of birth, ID band Patient awake    Reviewed: Allergy & Precautions, H&P , NPO status , Patient's Chart, lab work & pertinent test results  Airway Mallampati: III  TM Distance: <3 FB Neck ROM: full    Dental  (+) Edentulous Lower, Edentulous Upper, Dental Advisory Given   Pulmonary neg shortness of breath, sleep apnea ,  Non compliant with CPAP   Pulmonary exam normal breath sounds clear to auscultation       Cardiovascular Exercise Tolerance: Good hypertension, Pt. on medications (-) angina(-) Past MI and (-) DOE Normal cardiovascular exam Rhythm:Regular Rate:Normal     Neuro/Psych Anxiety Proliferative diabetic retinopathy with retinal detachment OD    GI/Hepatic negative GI ROS, Neg liver ROS,   Endo/Other  diabetes, Poorly Controlled, Type 2, Insulin Dependent, Oral Hypoglycemic AgentsMorbid obesity  Renal/GU negative Renal ROS  negative genitourinary   Musculoskeletal negative musculoskeletal ROS (+)   Abdominal (+) + obese,   Peds  Hematology negative hematology ROS (+)   Anesthesia Other Findings      Reproductive/Obstetrics negative OB ROS                            Anesthesia Physical  Anesthesia Plan  ASA: III  Anesthesia Plan: MAC   Post-op Pain Management:    Induction: Intravenous  PONV Risk Score and Plan: 2 and Ondansetron and Treatment may vary due to age or medical condition  Airway Management Planned: Nasal Cannula and Natural Airway  Additional Equipment:   Intra-op Plan:   Post-operative Plan:   Informed Consent: I have reviewed the patients History and Physical, chart, labs and discussed the procedure including the risks, benefits and alternatives for the proposed anesthesia with the patient or authorized representative who has indicated his/her understanding and acceptance.   Dental Advisory  Given  Plan Discussed with: Anesthesiologist, CRNA and Surgeon  Anesthesia Plan Comments: (  )        Anesthesia Quick Evaluation

## 2017-08-15 NOTE — Anesthesia Postprocedure Evaluation (Signed)
Anesthesia Post Note  Patient: Kathryn Hogan  Procedure(s) Performed: PARS PLANA VITRECTOMY WITH 25 GAUGE MEMBRANE PILL WITH AIR GAS SILICONE OIL AND PHOTOCOAGULATION  RIGHT (Right Eye)     Patient location during evaluation: PACU Anesthesia Type: MAC Level of consciousness: awake and alert Pain management: pain level controlled Vital Signs Assessment: post-procedure vital signs reviewed and stable Respiratory status: spontaneous breathing, nonlabored ventilation, respiratory function stable and patient connected to nasal cannula oxygen Cardiovascular status: stable and blood pressure returned to baseline Postop Assessment: no apparent nausea or vomiting Anesthetic complications: no    Last Vitals:  Vitals:   08/15/17 1818 08/15/17 1833  BP: (!) 152/94 (!) 171/93  Pulse: 93 91  Resp: 17 14  Temp: 36.5 C   SpO2: 97% 92%    Last Pain:  Vitals:   08/15/17 1818  TempSrc:   PainSc: 8                  Bryar Dahms

## 2017-08-15 NOTE — Discharge Instructions (Addendum)
DO NOT SLEEP ON  NOSE TO PILLOW  DURING DAY KEEP KEEP UPRIGHT   Post Anesthesia Home Care Instructions  Activity: Get plenty of rest for the remainder of the day. A responsible individual must stay with you for 24 hours following the procedure.  For the next 24 hours, DO NOT: -Drive a car -Paediatric nurse -Drink alcoholic beverages -Take any medication unless instructed by your physician -Make any legal decisions or sign important papers.  Meals: Start with liquid foods such as gelatin or soup. Progress to regular foods as tolerated. Avoid greasy, spicy, heavy foods. If nausea and/or vomiting occur, drink only clear liquids until the nausea and/or vomiting subsides. Call your physician if vomiting continues.  Special Instructions/Symptoms: Your throat may feel dry or sore from the anesthesia or the breathing tube placed in your throat during surgery. If this causes discomfort, gargle with warm salt water. The discomfort should disappear within 24 hours.  If you had a scopolamine patch placed behind your ear for the management of post- operative nausea and/or vomiting:  1. The medication in the patch is effective for 72 hours, after which it should be removed.  Wrap patch in a tissue and discard in the trash. Wash hands thoroughly with soap and water. 2. You may remove the patch earlier than 72 hours if you experience unpleasant side effects which may include dry mouth, dizziness or visual disturbances. 3. Avoid touching the patch. Wash your hands with soap and water after contact with the patch.

## 2017-08-15 NOTE — Brief Op Note (Signed)
08/15/2017  6:08 PM  PATIENT:  Kathryn Hogan  47 y.o. 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  PROCEDURE:  Procedure(s): TRACTIONAL RETINAL DETACHMENT REPAIR: PARS PLANA VITRECTOMY WITH 25 GAUGE MEMBRANE PEAL, MEMBRANECTOMY WITH AIR GAS SILICONE OIL AND PHOTOCOAGULATION  RIGHT (Right)  SURGEON:  Surgeon(s) and Role:    * Jalene Mullet, MD - Primary  PHYSICIAN ASSISTANT:   ASSISTANTS: none   ANESTHESIA:   local and MAC  EBL:  MINIMAL   BLOOD ADMINISTERED:none  DRAINS: none   LOCAL MEDICATIONS USED:  MARCAINE    and LIDOCAINE   SPECIMEN:  No Specimen  DISPOSITION OF SPECIMEN:  N/A  COUNTS:  YES  TOURNIQUET:  * No tourniquets in log *  DICTATION: .Note written in EPIC  PLAN OF CARE: Discharge to home after PACU  PATIENT DISPOSITION:  PACU - hemodynamically stable.   Delay start of Pharmacological VTE agent (>24hrs) due to surgical blood loss or risk of bleeding: not applicable

## 2017-08-15 NOTE — Transfer of Care (Signed)
Immediate Anesthesia Transfer of Care Note  Patient: Kathryn Hogan  Procedure(s) Performed: PARS PLANA VITRECTOMY WITH 25 GAUGE MEMBRANE PILL WITH AIR GAS SILICONE OIL AND PHOTOCOAGULATION  RIGHT (Right Eye)  Patient Location: PACU  Anesthesia Type:MAC  Level of Consciousness: awake, alert  and oriented  Airway & Oxygen Therapy: Patient Spontanous Breathing and Patient connected to nasal cannula oxygen  Post-op Assessment: Report given to RN and Post -op Vital signs reviewed and stable  Post vital signs: Reviewed and stable  Last Vitals:  Vitals Value Taken Time  BP 152/94 08/15/2017  6:18 PM  Temp    Pulse 94 08/15/2017  6:19 PM  Resp 26 08/15/2017  6:19 PM  SpO2 97 % 08/15/2017  6:19 PM  Vitals shown include unvalidated device data.  Last Pain:  Vitals:   08/15/17 1259  TempSrc:   PainSc: 0-No pain         Complications: No apparent anesthesia complications

## 2017-08-15 NOTE — H&P (Signed)
Date of examination:  08/15/17  Indication for surgery: Severe proliferative diabetic retinopathy with tractional retinal detachment right eye  Pertinent past medical history:  Past Medical History:  Diagnosis Date  . Anxiety   . Diabetes mellitus without complication ([place])   . Hypertension   . Sleep apnea    does not use CPAP    Pertinent ocular history:  Severe proliferative diabetic retinopathy OU  Pertinent family history:  Family History  Problem Relation Age of Onset  . Breast cancer Sister 35  . Esophageal cancer Mother   . Colon cancer Father   . Bone cancer Sister     General:  Healthy appearing patient in no distress.    Eyes:    Acuity OD CF    External: Within normal limits      Anterior segment: Within normal limits            Fundus: TRD with severe proliferative diabetic retinopathy   B-scan - TRD   Impression: Severe proliferative diabetic retinopathy with tractional retinal detachment right eye   Plan: Tractional retinal detachment repair right eye  Royston Cowper

## 2017-08-18 ENCOUNTER — Encounter (HOSPITAL_COMMUNITY): Payer: Self-pay | Admitting: Ophthalmology

## 2017-08-19 NOTE — Addendum Note (Signed)
Addendum  created 08/19/17 0711 by Josephine Igo, MD   [place] recorded in [place], [place] filed

## 2017-09-16 NOTE — Op Note (Signed)
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  Procedure: Complex Tractional retinal detachment repair right eye Operative Eye:  Right eye Surgeon: Royston Cowper Estimated Blood Loss: minimal Specimens for Pathology:  None Complications: None    The  patient was prepped and draped in the usual fashion for ocular surgery on the right eye .  A lid speculum was placed.  Infusion line and trocar was placed at the 8 o'clock position approximately 3.5 mm from the surgical limbus.   The infusion line was allowed to run and then clamped when placed at the cannula opening. The line was inserted and secured to the drape with an adhesive strip.   Active trocars/cannula were placed at the 10 and 2 o'clock positions approximately 3.5 mm from the surgical limbus. The cannula was visualized in the vitreous cavity.  The light pipe and vitreous cutter were inserted into the vitreous cavity and a core vitrectomy was performed.  The retina was completely detached with notable fibrosis along the arcades and associated tractional detachment.  Care taken to remove the vitreous attachments to the arcades and to the periphery.     Careful indented vitrectomy was performed to shave vitreous from the vitreous base as well as remove any traction from the gliotic bands along the arcades.  An inferior retinectomy was performed to relieve traction along the arcades and the fibrotic tissue overlying the arcades was carefully removed.  Following careful segmentation and delamination of the tractional components of the retinal detachment, thick subretinal fluid was drained in an attempt to flatten the retina.  Additional fibrotis tissue was excised and the retinal flattened.  .  Areas of anterior loop traction were relieved with localized retinectomy from 5 to 8 o'clock.  Perflouron was used to flatten there retina and better delineate the vitreous base.  Endolaser  was applied 360 degrees with the aid of scleral indentation to ensure adequate demarcation of the peripheral retina.  Air fluid exchange was performed and the Perflouron was completely removed with the aid of a BSS wash once 98% air fill was achieved in the posterior chamber.  Silicone oil was then placed in the eye to achieve a near complete oil fill.  The trocars were sequentially removed and each sclerotomy closed with 8-0 Vicryl suture.  Normal intraocular pressure was noted by digital palpapation.  Subconjunctival injection of  Dexamethasone 4mg /78ml was placed.   The speculum and drapes were removed and the eye was patched with Polymixin/Bacitracin ophthalmic ointment. An eye shield was placed and the patient was transferred alert and conversant with stable vital signs to the post operative recovery area.  The patient tolerated the procedure well and no complications were noted.  Royston Cowper MD

## 2018-05-04 NOTE — Progress Notes (Signed)
Attempted numerous times to reach pt for pre-op call. Left pre-op instructions on pt's voicemail. Instructed pt to take 1/2 of her regular dose of her Levemir Insulin tonight and in the AM. Instructed her to check her blood sugar in the AM when she gets up and every 2 hours until she leaves for the hospital. If blood sugar is 70 or below, treat with 1/2 cup of clear juice (apple or cranberry) and recheck blood sugar 15 minutes after drinking juice. If blood sugar continues to be 70 or below, call the Short Stay department and ask to speak to a nurse.

## 2018-05-05 ENCOUNTER — Encounter (HOSPITAL_COMMUNITY): Payer: Self-pay | Admitting: Anesthesiology

## 2018-05-05 ENCOUNTER — Ambulatory Visit (HOSPITAL_COMMUNITY): Admission: RE | Admit: 2018-05-05 | Payer: BLUE CROSS/BLUE SHIELD | Source: Home / Self Care | Admitting: Ophthalmology

## 2018-05-05 SURGERY — PARS PLANA VITRECTOMY WITH 20 GAUGE
Anesthesia: Monitor Anesthesia Care | Laterality: Right

## 2018-05-05 MED ORDER — CEFAZOLIN SUBCONJUNCTIVAL INJECTION 100 MG/0.5 ML
200.0000 mg | INJECTION | SUBCONJUNCTIVAL | Status: AC
  Filled 2018-05-05: qty 5

## 2019-09-16 ENCOUNTER — Other Ambulatory Visit: Payer: Self-pay

## 2019-09-16 ENCOUNTER — Encounter (HOSPITAL_COMMUNITY): Payer: Self-pay | Admitting: Emergency Medicine

## 2019-09-16 ENCOUNTER — Emergency Department (HOSPITAL_COMMUNITY)
Admission: EM | Admit: 2019-09-16 | Discharge: 2019-09-16 | Disposition: A | Discharge status: Home / Self Care | Payer: Self-pay | Attending: Emergency Medicine | Admitting: Emergency Medicine

## 2019-09-16 ENCOUNTER — Emergency Department (HOSPITAL_COMMUNITY): Payer: Self-pay

## 2019-09-16 DIAGNOSIS — I10 Essential (primary) hypertension: Secondary | ICD-10-CM | POA: Insufficient documentation

## 2019-09-16 DIAGNOSIS — R202 Paresthesia of skin: Secondary | ICD-10-CM | POA: Insufficient documentation

## 2019-09-16 DIAGNOSIS — Z79899 Other long term (current) drug therapy: Secondary | ICD-10-CM | POA: Insufficient documentation

## 2019-09-16 DIAGNOSIS — R200 Anesthesia of skin: Secondary | ICD-10-CM

## 2019-09-16 DIAGNOSIS — Z7984 Long term (current) use of oral hypoglycemic drugs: Secondary | ICD-10-CM | POA: Insufficient documentation

## 2019-09-16 DIAGNOSIS — R519 Headache, unspecified: Secondary | ICD-10-CM | POA: Insufficient documentation

## 2019-09-16 DIAGNOSIS — E119 Type 2 diabetes mellitus without complications: Secondary | ICD-10-CM | POA: Insufficient documentation

## 2019-09-16 LAB — DIFFERENTIAL
Abs Immature Granulocytes: 0.06 10*3/uL (ref 0.00–0.07)
Basophils Absolute: 0 10*3/uL (ref 0.0–0.1)
Basophils Relative: 0 %
Eosinophils Absolute: 0.2 10*3/uL (ref 0.0–0.5)
Eosinophils Relative: 2 %
Immature Granulocytes: 1 %
Lymphocytes Relative: 18 %
Lymphs Abs: 1.8 10*3/uL (ref 0.7–4.0)
Monocytes Absolute: 0.4 10*3/uL (ref 0.1–1.0)
Monocytes Relative: 4 %
Neutro Abs: 7.2 10*3/uL (ref 1.7–7.7)
Neutrophils Relative %: 75 %

## 2019-09-16 LAB — I-STAT CHEM 8, ED
BUN: 23 mg/dL — ABNORMAL HIGH (ref 6–20)
Calcium, Ion: 1.1 mmol/L — ABNORMAL LOW (ref 1.15–1.40)
Chloride: 94 mmol/L — ABNORMAL LOW (ref 98–111)
Creatinine, Ser: 1 mg/dL (ref 0.44–1.00)
Glucose, Bld: 333 mg/dL — ABNORMAL HIGH (ref 70–99)
HCT: 41 % (ref 36.0–46.0)
Hemoglobin: 13.9 g/dL (ref 12.0–15.0)
Potassium: 3.6 mmol/L (ref 3.5–5.1)
Sodium: 137 mmol/L (ref 135–145)
TCO2: 28 mmol/L (ref 22–32)

## 2019-09-16 LAB — COMPREHENSIVE METABOLIC PANEL
ALT: 14 U/L (ref 0–44)
AST: 14 U/L — ABNORMAL LOW (ref 15–41)
Albumin: 3 g/dL — ABNORMAL LOW (ref 3.5–5.0)
Alkaline Phosphatase: 86 U/L (ref 38–126)
Anion gap: 8 (ref 5–15)
BUN: 19 mg/dL (ref 6–20)
CO2: 31 mmol/L (ref 22–32)
Calcium: 9 mg/dL (ref 8.9–10.3)
Chloride: 95 mmol/L — ABNORMAL LOW (ref 98–111)
Creatinine, Ser: 1.1 mg/dL — ABNORMAL HIGH (ref 0.44–1.00)
GFR calc Af Amer: >60 mL/min (ref >60)
GFR calc non Af Amer: 58 mL/min — ABNORMAL LOW (ref >60)
Glucose, Bld: 341 mg/dL — ABNORMAL HIGH (ref 70–99)
Potassium: 3.9 mmol/L (ref 3.5–5.1)
Sodium: 134 mmol/L — ABNORMAL LOW (ref 135–145)
Total Bilirubin: 0.7 mg/dL (ref 0.3–1.2)
Total Protein: 6.3 g/dL — ABNORMAL LOW (ref 6.5–8.1)

## 2019-09-16 LAB — CBC
HCT: 40.3 % (ref 36.0–46.0)
Hemoglobin: 13 g/dL (ref 12.0–15.0)
MCH: 26.4 pg (ref 26.0–34.0)
MCHC: 32.3 g/dL (ref 30.0–36.0)
MCV: 81.7 fL (ref 80.0–100.0)
Platelets: 286 10*3/uL (ref 150–400)
RBC: 4.93 MIL/uL (ref 3.87–5.11)
RDW: 12.4 % (ref 11.5–15.5)
WBC: 9.6 10*3/uL (ref 4.0–10.5)
nRBC: 0 % (ref 0.0–0.2)

## 2019-09-16 LAB — PROTIME-INR
INR: 0.9 (ref 0.8–1.2)
Prothrombin Time: 12.1 seconds (ref 11.4–15.2)

## 2019-09-16 LAB — APTT: aPTT: 25 seconds (ref 24–36)

## 2019-09-16 LAB — I-STAT BETA HCG BLOOD, ED (MC, WL, AP ONLY): I-stat hCG, quantitative: <5 m[IU]/mL (ref <5)

## 2019-09-16 LAB — CBG MONITORING, ED: Glucose-Capillary: 330 mg/dL — ABNORMAL HIGH (ref 70–99)

## 2019-09-16 MED ORDER — DIPHENHYDRAMINE HCL 50 MG/ML IJ SOLN
25.0000 mg | Freq: Once | INTRAMUSCULAR | Status: AC
  Administered 2019-09-16: 25 mg via INTRAVENOUS
  Filled 2019-09-16: qty 1

## 2019-09-16 MED ORDER — BENAZEPRIL HCL 40 MG PO TABS
40.0000 mg | ORAL_TABLET | Freq: Every day | ORAL | 1 refills | Status: AC
Start: 2019-09-16 — End: ?

## 2019-09-16 MED ORDER — SODIUM CHLORIDE 0.9% FLUSH
3.0000 mL | Freq: Once | INTRAVENOUS | Status: AC
Start: 2019-09-16 — End: 2019-09-16
  Administered 2019-09-16: 3 mL via INTRAVENOUS

## 2019-09-16 MED ORDER — PROCHLORPERAZINE EDISYLATE 10 MG/2ML IJ SOLN
10.0000 mg | Freq: Once | INTRAMUSCULAR | Status: AC
  Administered 2019-09-16: 10 mg via INTRAVENOUS
  Filled 2019-09-16: qty 2

## 2019-09-16 MED ORDER — SODIUM CHLORIDE 0.9 % IV BOLUS
500.0000 mL | Freq: Once | INTRAVENOUS | Status: AC
  Administered 2019-09-16: 500 mL via INTRAVENOUS

## 2019-09-16 MED ORDER — LABETALOL HCL 5 MG/ML IV SOLN
5.0000 mg | Freq: Once | INTRAVENOUS | Status: AC
  Administered 2019-09-16: 5 mg via INTRAVENOUS
  Filled 2019-09-16: qty 4

## 2019-09-16 NOTE — ED Triage Notes (Signed)
Pt presents to ED POV. Pt c/o numbness in L side of face, l arm, and l leg. Pt als c/o weakness, ataxia. CBG at home - 362. LKW - 1900. Triage bp 236/114 Upon triage assessment [place] - 0.

## 2019-09-16 NOTE — ED Provider Notes (Signed)
8:52 AM Signout from [place] PA-C at shift change.   Patient with numbness and tingling in setting of headache and elevated blood pressures.  MRI was ordered to evaluate for signs of stroke.  MRI does not show any acute strokes.  I reviewed these findings with the patient.  We discussed that her symptoms may have been related to the headache that she had.  However she will require close monitoring of her blood pressure, blood sugar.  She states that her headache is resolved and she is feeling back to her normal self.  She is comfortable with discharged home at this time.  I strongly encouraged PCP follow-up in the next 48 to 72 hours for recheck of her symptoms as well as her blood pressure.  We discussed possibility of TIA.  Patient counseled to return if they have weakness in their arms or legs, slurred speech, trouble walking or talking, confusion, trouble with their balance, or if they have any other concerns. Patient verbalizes understanding and agrees with plan.   BP (!) 170/99   Pulse 88   Temp 98 F (36.7 C) (Oral)   Resp 14   Ht 5\' 1"  (1.549 m)   Wt (!) 129.3 kg   SpO2 94%   BMI 53.85 kg/m   MR BRAIN WO CONTRAST  Result Date: 09/16/2019 CLINICAL DATA:  Transient ischemic attack (TIA). Additional provided: Patient reports numbness in left side of face, left arm, left leg, weakness, ataxia. EXAM: MRI HEAD WITHOUT CONTRAST TECHNIQUE: Multiplanar, multiecho pulse sequences of the brain and surrounding structures were obtained without intravenous contrast. COMPARISON:  Head CT 09/16/2019 FINDINGS: Brain: Cerebral volume is normal for age. Mild scattered T2/FLAIR hyperintensity within the cerebral white matter is nonspecific, but consistent with chronic small vessel ischemic disease. There is no acute infarct. No evidence of intracranial mass. No chronic intracranial blood products. No extra-axial fluid collection. No midline shift. Vascular: Expected proximal arterial flow voids. Skull and  upper cervical spine: No focal marrow lesion. Sinuses/Orbits: T2 hypointense signal abnormality within the right globe, likely postprocedural. Mild ethmoid sinus mucosal thickening. Trace fluid within right mastoid air cells. IMPRESSION: No evidence of acute intracranial abnormality, including acute infarction. Mild chronic small vessel ischemic changes within the cerebral white matter. Signal abnormality within the right globe which is likely postprocedural. However, correlate with procedural history. Trace right mastoid effusion. Electronically Signed   By: Kellie Simmering DO   On: 09/16/2019 08:40      Carlisle Cater, PA-C 09/16/19 1610    Lucrezia Starch, MD 09/17/19 [phone]

## 2019-09-16 NOTE — ED Provider Notes (Signed)
MSE was initiated and I personally evaluated the patient and placed orders (if any) at  1:28 AM on September 16, 2019.  The patient appears stable so that the remainder of the MSE may be completed by another provider.  49 year old female comes in with right-sided headache since about 7 PM with associated numbness in the left side of the face and left arm and leg and some weakness of the left leg.  Numbness and weakness have completely resolved, but headache persists.  No visual disturbance, mild nausea.  Photophobia is present.  She is noted to be significantly hypertensive.  Neurologic exam shows no cranial nerve deficits, strength 5/5 in all 4 extremities, no pronator drift, no objective numbness, no extinction on double simultaneous stimulation.  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  She will be sent for CT of head.  With normal neurologic exam currently, no need to activate code stroke.   Delora Fuel, MD [birth year] [phone]

## 2019-09-16 NOTE — ED Notes (Signed)
Patient transported to MRI 

## 2019-09-16 NOTE — ED Provider Notes (Signed)
[facility] EMERGENCY DEPARTMENT Provider Note   CSN: [id number] Arrival date & time: 09/16/19  0035     History Chief Complaint  Patient presents with  . Numbness    Kathryn Hogan is a 49 y.o. female.  49 y.o female with a chief complaint of numbness times 7 PM tonight.  Patient reports she was at home when she suddenly began to experience some numbness and tingling to the left side of her face, this then traveled to her left hand and as she attempted to go to the bathroom to urinate she felt like her left leg became weak and was unable to ambulate all of this was only to the left side affected.  She reports attempting to check her blood sugar, noted that she was unable to perform this task.  She reports after this episode, she began to feel a really bad headache, stating this is a sharp pain to the right of her eye, the back of her head, the back of her temple.  She also endorses some photophobia with this, along with nausea.  No prior history of migraines.  No fever, no neck pain, no prior history of migraines, no trauma.     The history is provided by the patient.       Past Medical History:  Diagnosis Date  . Anxiety   . Diabetes mellitus without complication ([place])   . Hypertension   . Sleep apnea    does not use CPAP    There are no problems to display for this patient.   Past Surgical History:  Procedure Laterality Date  . GAS/FLUID EXCHANGE Left 07/25/2017   Procedure: GAS/FLUID EXCHANGE;  Surgeon: Jalene Mullet, MD;  Location: [facility];  Service: Ophthalmology;  Laterality: Left;  SF6  . PARS PLANA VITRECTOMY Left 07/25/2017   Procedure: PARS PLANA VITRECTOMY WITH 25 GAUGE LEFT EYE;  Surgeon: Jalene Mullet, MD;  Location: [facility];  Service: Ophthalmology;  Laterality: Left;  . PARS PLANA VITRECTOMY Right 08/15/2017   Procedure: PARS PLANA VITRECTOMY WITH 25 GAUGE MEMBRANE PILL WITH AIR GAS SILICONE OIL AND PHOTOCOAGULATION  RIGHT;  Surgeon: Jalene Mullet, MD;  Location: [facility];  Service: Ophthalmology;  Laterality: Right;  . PHOTOCOAGULATION WITH LASER Left 07/25/2017   Procedure: PHOTOCOAGULATION WITH LASER;  Surgeon: Jalene Mullet, MD;  Location: [facility];  Service: Ophthalmology;  Laterality: Left;  . TUBAL LIGATION    . UMBILICAL HERNIA REPAIR N/A 08/02/2015   Procedure: HERNIA REPAIR UMBILICAL ADULT;  Surgeon: Christene Lye, MD;  Location: [facility];  Service: General;  Laterality: N/A;     OB History   No obstetric history on file.     Family History  Problem Relation Age of Onset  . Breast cancer Sister 64  . Esophageal cancer Mother   . Colon cancer Father   . Bone cancer Sister     Social History   Tobacco Use  . Smoking status: Never Smoker  . Smokeless tobacco: Never Used  Vaping Use  . Vaping Use: Never used  Substance Use Topics  . Alcohol use: Yes    Alcohol/week: 0.0 standard drinks    Comment: rare to occasional  . Drug use: Not Currently    Home Medications Prior to Admission medications   Medication Sig Start Date End Date Taking? Authorizing Provider  acetaminophen (TYLENOL) 500 MG tablet Take 1,000 mg by mouth every 6 (six) hours as needed for moderate pain.   Yes [provider]  ibuprofen (ADVIL) 200 MG tablet Take 400 mg by mouth daily as needed for moderate pain.   Yes [provider]  insulin glargine (LANTUS) 100 unit/mL SOPN Inject 40 Units into the skin daily.   Yes [provider]  metoprolol succinate (TOPROL-XL) 100 MG 24 hr tablet Take 100 mg by mouth daily. Take with or immediately following a meal.   Yes [provider]  Semaglutide,0.25 or 0.5MG /DOS, (OZEMPIC, 0.25 OR 0.5 MG/DOSE,) 2 MG/1.5ML SOPN Inject 0.25 mg into the skin once a week. Administer 0.25mg  once a week for 4 weeks, then increase dose to 0.5mg  weekly. 01/04/19  Yes [provider]  torsemide (DEMADEX) 20 MG tablet Take 20 mg by mouth 2 (two) times daily. 12/02/18  Yes  [provider]  amLODipine (NORVASC) 5 MG tablet Take 1 tablet (5 mg total) by mouth daily. Patient not taking: Reported on 07/24/2017 07/14/16   Sherwood Gambler, MD  benazepril (LOTENSIN) 40 MG tablet Take 1 tablet (40 mg total) by mouth daily. 07/14/16   Sherwood Gambler, MD  clindamycin (CLEOCIN) 300 MG capsule Take 1 capsule (300 mg total) by mouth 4 (four) times daily. Patient not taking: Reported on 07/24/2017 03/24/17   Cuthriell, Charline Bills, PA-C  hydrochlorothiazide (HYDRODIURIL) 25 MG tablet Take 1 tablet (25 mg total) by mouth daily. Patient not taking: Reported on 09/16/2019 07/14/16   Sherwood Gambler, MD  HYDROcodone-acetaminophen (NORCO/VICODIN) 5-325 MG tablet Take 1 tablet by mouth every 4 (four) hours as needed for moderate pain. Patient not taking: Reported on 07/24/2017 03/24/17   Cuthriell, Charline Bills, PA-C  Insulin Detemir (LEVEMIR) 100 UNIT/ML Pen Inject 20 Units into the skin 2 (two) times daily. Patient not taking: Reported on 09/16/2019 07/14/16   Sherwood Gambler, MD  metFORMIN (GLUCOPHAGE) 500 MG tablet Take 2 tablets (1,000 mg total) by mouth 2 (two) times daily with a meal. Patient not taking: Reported on 07/24/2017 07/14/16   Sherwood Gambler, MD  polyethylene glycol powder (GLYCOLAX/MIRALAX) powder Take 255 g by mouth once. Patient not taking: Reported on 07/24/2017 09/07/15   Christene Lye, MD    Allergies    Patient has no known allergies.  Review of Systems   Review of Systems  Constitutional: Negative for chills and fever.  Eyes: Positive for photophobia.  Respiratory: Negative for shortness of breath.   Cardiovascular: Negative for chest pain.  Gastrointestinal: Positive for nausea. Negative for abdominal pain and vomiting.  Genitourinary: Negative for flank pain.  Musculoskeletal: Negative for back pain.  Skin: Negative for pallor.  Neurological: Positive for numbness and headaches. Negative for syncope, speech difficulty and weakness.  All other systems  reviewed and are negative.   Physical Exam Updated Vital Signs BP (!) 170/99   Pulse 88   Temp 98 F (36.7 C) (Oral)   Resp 14   Ht 5\' 1"  (1.549 m)   Wt (!) 129.3 kg   SpO2 94%   BMI 53.85 kg/m   Physical Exam Vitals and nursing note reviewed.  Constitutional:      Appearance: Normal appearance.  HENT:     Head: Normocephalic and atraumatic.     Nose: Nose normal.     Mouth/Throat:     Mouth: Mucous membranes are moist.  Cardiovascular:     Rate and Rhythm: Normal rate.  Pulmonary:     Effort: Pulmonary effort is normal.     Breath sounds: No wheezing.  Abdominal:     General: Abdomen is flat.     Tenderness:  There is no abdominal tenderness.  Musculoskeletal:     Cervical back: Normal range of motion.  Skin:    General: Skin is warm and dry.  Neurological:     Mental Status: She is alert and oriented to person, place, and time.     Comments: Alert, oriented, thought content appropriate. Speech fluent without evidence of aphasia. Able to follow 2 step commands without difficulty.  Cranial Nerves:  II:  Peripheral visual fields grossly normal, pupils, round, reactive to light III,IV, VI: ptosis not present, extra-ocular motions intact bilaterally  V,VII: smile symmetric, facial light touch sensation equal VIII: hearing grossly normal bilaterally  IX,X: midline uvula rise  XI: bilateral shoulder shrug equal and strong XII: midline tongue extension  Motor:  5/5 in upper and lower extremities bilaterally including strong and equal grip strength and dorsiflexion/plantar flexion Sensory: light touch normal in all extremities.  Cerebellar: normal finger-to-nose with bilateral upper extremities, pronator drift negative       ED Results / Procedures / Treatments   Labs (all labs ordered are listed, but only abnormal results are displayed) Labs Reviewed  COMPREHENSIVE METABOLIC PANEL - Abnormal; Notable for the following components:      Result Value   Sodium 134  (*)    Chloride 95 (*)    Glucose, Bld 341 (*)    Creatinine, Ser 1.10 (*)    Total Protein 6.3 (*)    Albumin 3.0 (*)    AST 14 (*)    GFR calc non Af Amer 58 (*)    All other components within normal limits  I-STAT CHEM 8, ED - Abnormal; Notable for the following components:   Chloride 94 (*)    BUN 23 (*)    Glucose, Bld 333 (*)    Calcium, Ion 1.10 (*)    All other components within normal limits  CBG MONITORING, ED - Abnormal; Notable for the following components:   Glucose-Capillary 330 (*)    All other components within normal limits  PROTIME-INR  APTT  CBC  DIFFERENTIAL  I-STAT BETA HCG BLOOD, ED ([facility], [facility], [facility] ONLY)    EKG EKG Interpretation  Date/Time:  Thursday September 16 2019 01:19:11 EDT Ventricular Rate:  95 PR Interval:  174 QRS Duration: 94 QT Interval:  360 QTC Calculation: 452 R Axis:   15 Text Interpretation: Normal sinus rhythm Normal ECG When compared with ECG of 07/25/2017, Premature ventricular complexes are no longer present Confirmed by Delora Fuel ([id number]) on 09/16/2019 2:02:21 AM   Radiology CT Head Wo Contrast  Result Date: 09/16/2019 CLINICAL DATA:  Headache EXAM: CT HEAD WITHOUT CONTRAST TECHNIQUE: Contiguous axial images were obtained from the base of the skull through the vertex without intravenous contrast. COMPARISON:  None. FINDINGS: Brain: No evidence of acute territorial infarction, hemorrhage, hydrocephalus,extra-axial collection or mass lesion/mass effect. There is mild low-attenuation changes in the deep white matter consistent with small vessel ischemia. Ventricles are normal in size and contour. Vascular: No hyperdense vessel or unexpected calcification. Skull: The skull is intact. No fracture or focal lesion identified. Sinuses/Orbits: The visualized paranasal sinuses and mastoid air cells are clear. The orbits and globes intact. Other: None IMPRESSION: No acute intracranial abnormality. Findings consistent mild chronic small vessel ischemia  Electronically Signed   By: Prudencio Pair M.D.   On: 09/16/2019 02:42    Procedures Procedures (including critical care time)  Medications Ordered in ED Medications  sodium chloride flush (NS) 0.9 % injection 3 mL (3 mLs Intravenous Given 09/16/19  4259)  prochlorperazine (COMPAZINE) injection 10 mg (10 mg Intravenous Given 09/16/19 0331)  diphenhydrAMINE (BENADRYL) injection 25 mg (25 mg Intravenous Given 09/16/19 0331)  sodium chloride 0.9 % bolus 500 mL (0 mLs Intravenous Stopped 09/16/19 0505)  labetalol (NORMODYNE) injection 5 mg (5 mg Intravenous Given 09/16/19 0330)    ED Course  I have reviewed the triage vital signs and the nursing notes.  Pertinent labs & imaging results that were available during my care of the patient were reviewed by me and considered in my medical decision making (see chart for details).    MDM Rules/Calculators/A&P     Patient with a past medical history of diabetes, hypertension, chronic kidney failure presents to the ED with a chief complaint of left arm weakness and numbness which began around 7 PM tonight, describing it as left numbness and tingling of her face, left arm, left leg, reports some ataxia when this occurred.  States that she was unable to check her blood sugar as she was unable to perform this task.  She was screaming at EMS bridge, had a CT head ordered by prior provider.  During my evaluation patient is overall well-appearing, blood pressure is remarkable for systolic in the 563O, diastolic in the upper 756E.  She is neurologically intact without any deficit noted on my exam, sensation is equal throughout, moves all upper and lower extremities.  Denies any recent sickness, headaches, fever, neck pain.  Interpretation of her labs revealed a CMP with slight elevation of her glucose at 341, delayed gap, I have a lower suspicion for DKA at this time.  CBC without any leukocytosis, hemoglobin is stable. PT and INR stable.   CT head showed: No acute  intracranial abnormality.    Findings consistent mild chronic small vessel ischemia     3:04 AM Called placed to Dr. Rory Percy for further recommendations. MRI recommended.   Patient was provided with a headache cocktail consisting of Compazine, Benadryl, 500 bolus of fluid was also given to help with hyperglycemia.  I have also added 5 mg of labetalol for blood pressure control.  4:53 AM Patient reassessed by me, reports improvement in her headache, blood pressure now with a systolic in the 332R, diastolic in the 51O.  Reports headache has significantly improved after medication.  She is aware she will need MRI in order to further evaluate and rule out any neurological condition at this time.  She remains otherwise hemodynamically stable.  7:27 AM Patient care signed out to Alvera Singh pending MRI Brain, suspect patient will likely disposition home if MRI within normal limits.     Portions of this note were generated with [employer]. Dictation errors may occur despite best attempts at proofreading.  Final Clinical Impression(s) / ED Diagnoses Final diagnoses:  Left sided numbness  Bad headache    Rx / DC Orders ED Discharge Orders    None       Janeece Fitting, PA-C 09/16/19 [id number]    Orpah Greek, MD 09/16/19 [phone]

## 2019-09-16 NOTE — Discharge Instructions (Signed)
Please read and follow all provided instructions.  Your diagnoses today include:  1. Left sided numbness   2. Bad headache     Tests performed today include:  CT of your head which was normal and did not show any serious cause of your headache  MRI of your brain -did not show any strokes  Blood counts and electrolytes -high blood sugar  Vital signs. See below for your results today.   Additional information:  Follow any educational materials contained in this packet.  You are having a headache. No specific cause was found today for your headache. It may have been a migraine or other cause of headache. Stress, anxiety, fatigue, and depression are common triggers for headaches.   Your headache today does not appear to be life-threatening or require hospitalization, but often the exact cause of headaches is not determined in the emergency department. Therefore, follow-up with your doctor is very important to find out what may have caused your headache and whether or not you need any further diagnostic testing or treatment.   Sometimes headaches can appear benign (not harmful), but then more serious symptoms can develop which should prompt an immediate re-evaluation by your doctor or the emergency department.  BE VERY CAREFUL not to take multiple medicines containing Tylenol (also called acetaminophen). Doing so can lead to an overdose which can damage your liver and cause liver failure and possibly death.   Follow-up instructions: Please follow-up with your primary care provider in the next 2 days for further evaluation of your symptoms.   You need to have your blood pressure watched very carefully as well as your blood sugars.  Uncontrolled high blood pressure and high blood sugar can lead to kidney damage, stroke, heart disease.   Return instructions:   Please return to the Emergency Department if you experience worsening symptoms.  Return if the medications do not resolve your  headache, if it recurs, or if you have multiple episodes of vomiting or cannot keep down fluids.  Return if you have a change from the usual headache.  RETURN IMMEDIATELY IF you:  Develop a sudden, severe headache  Develop confusion or become poorly responsive or faint  Develop a fever above 100.55F or problem breathing  Have a change in speech, vision, swallowing, or understanding  Develop new weakness, numbness, tingling, incoordination in your arms or legs  Have a seizure  Please return if you have any other emergent concerns.  Additional Information:  Your vital signs today were: BP (!) 170/99   Pulse 88   Temp 98 F (36.7 C) (Oral)   Resp 14   Ht 5\' 1"  (1.549 m)   Wt (!) 129.3 kg   SpO2 94%   BMI 53.85 kg/m  If your blood pressure (BP) was elevated above 135/85 this visit, please have this repeated by your doctor within one month. --------------

## 2019-09-16 NOTE — ED Notes (Addendum)
Dr. Roxanne Mins came to assess pt, no code stroke, verbal given for head CT

## 2019-11-06 ENCOUNTER — Other Ambulatory Visit: Payer: Self-pay

## 2019-11-06 ENCOUNTER — Inpatient Hospital Stay
Admission: EM | Admit: 2019-11-06 | Discharge: 2019-11-07 | DRG: 101 | Disposition: A | Discharge status: Home / Self Care | Payer: Medicaid Other | Attending: Internal Medicine | Admitting: Internal Medicine

## 2019-11-06 ENCOUNTER — Emergency Department: Payer: Medicaid Other

## 2019-11-06 DIAGNOSIS — R6510 Systemic inflammatory response syndrome (SIRS) of non-infectious origin without acute organ dysfunction: Secondary | ICD-10-CM

## 2019-11-06 DIAGNOSIS — G4733 Obstructive sleep apnea (adult) (pediatric): Secondary | ICD-10-CM | POA: Diagnosis present

## 2019-11-06 DIAGNOSIS — Z6841 Body Mass Index (BMI) 40.0 and over, adult: Secondary | ICD-10-CM

## 2019-11-06 DIAGNOSIS — R519 Headache, unspecified: Secondary | ICD-10-CM | POA: Insufficient documentation

## 2019-11-06 DIAGNOSIS — G40909 Epilepsy, unspecified, not intractable, without status epilepticus: Principal | ICD-10-CM

## 2019-11-06 DIAGNOSIS — Z20822 Contact with and (suspected) exposure to covid-19: Secondary | ICD-10-CM | POA: Diagnosis present

## 2019-11-06 DIAGNOSIS — R739 Hyperglycemia, unspecified: Secondary | ICD-10-CM | POA: Diagnosis present

## 2019-11-06 DIAGNOSIS — Z800 Family history of malignant neoplasm of digestive organs: Secondary | ICD-10-CM

## 2019-11-06 DIAGNOSIS — E1165 Type 2 diabetes mellitus with hyperglycemia: Secondary | ICD-10-CM

## 2019-11-06 DIAGNOSIS — N179 Acute kidney failure, unspecified: Secondary | ICD-10-CM | POA: Diagnosis present

## 2019-11-06 DIAGNOSIS — R569 Unspecified convulsions: Secondary | ICD-10-CM

## 2019-11-06 DIAGNOSIS — I10 Essential (primary) hypertension: Secondary | ICD-10-CM

## 2019-11-06 DIAGNOSIS — Z794 Long term (current) use of insulin: Secondary | ICD-10-CM

## 2019-11-06 DIAGNOSIS — Z803 Family history of malignant neoplasm of breast: Secondary | ICD-10-CM

## 2019-11-06 LAB — OSMOLALITY: Osmolality: 308 mOsm/kg — ABNORMAL HIGH (ref 275–295)

## 2019-11-06 LAB — GLUCOSE, CAPILLARY
Glucose-Capillary: 118 mg/dL — ABNORMAL HIGH (ref 70–99)
Glucose-Capillary: 150 mg/dL — ABNORMAL HIGH (ref 70–99)
Glucose-Capillary: 150 mg/dL — ABNORMAL HIGH (ref 70–99)
Glucose-Capillary: 158 mg/dL — ABNORMAL HIGH (ref 70–99)
Glucose-Capillary: 170 mg/dL — ABNORMAL HIGH (ref 70–99)
Glucose-Capillary: 196 mg/dL — ABNORMAL HIGH (ref 70–99)
Glucose-Capillary: 217 mg/dL — ABNORMAL HIGH (ref 70–99)
Glucose-Capillary: 218 mg/dL — ABNORMAL HIGH (ref 70–99)
Glucose-Capillary: 222 mg/dL — ABNORMAL HIGH (ref 70–99)
Glucose-Capillary: 249 mg/dL — ABNORMAL HIGH (ref 70–99)
Glucose-Capillary: 291 mg/dL — ABNORMAL HIGH (ref 70–99)
Glucose-Capillary: 291 mg/dL — ABNORMAL HIGH (ref 70–99)
Glucose-Capillary: 292 mg/dL — ABNORMAL HIGH (ref 70–99)
Glucose-Capillary: 308 mg/dL — ABNORMAL HIGH (ref 70–99)
Glucose-Capillary: 329 mg/dL — ABNORMAL HIGH (ref 70–99)
Glucose-Capillary: 558 mg/dL (ref 70–99)

## 2019-11-06 LAB — COMPREHENSIVE METABOLIC PANEL
ALT: 17 U/L (ref 0–44)
AST: 22 U/L (ref 15–41)
Albumin: 3.2 g/dL — ABNORMAL LOW (ref 3.5–5.0)
Alkaline Phosphatase: 95 U/L (ref 38–126)
Anion gap: 13 (ref 5–15)
BUN: 18 mg/dL (ref 6–20)
CO2: 25 mmol/L (ref 22–32)
Calcium: 8.7 mg/dL — ABNORMAL LOW (ref 8.9–10.3)
Chloride: 94 mmol/L — ABNORMAL LOW (ref 98–111)
Creatinine, Ser: 1.08 mg/dL — ABNORMAL HIGH (ref 0.44–1.00)
GFR calc Af Amer: >60 mL/min (ref >60)
GFR calc non Af Amer: 59 mL/min — ABNORMAL LOW (ref >60)
Glucose, Bld: 590 mg/dL (ref 70–99)
Potassium: 3.8 mmol/L (ref 3.5–5.1)
Sodium: 132 mmol/L — ABNORMAL LOW (ref 135–145)
Total Bilirubin: 0.7 mg/dL (ref 0.3–1.2)
Total Protein: 6.7 g/dL (ref 6.5–8.1)

## 2019-11-06 LAB — CBC
HCT: 39.7 % (ref 36.0–46.0)
Hemoglobin: 13.8 g/dL (ref 12.0–15.0)
MCH: 27.3 pg (ref 26.0–34.0)
MCHC: 34.8 g/dL (ref 30.0–36.0)
MCV: 78.5 fL — ABNORMAL LOW (ref 80.0–100.0)
Platelets: 297 10*3/uL (ref 150–400)
RBC: 5.06 MIL/uL (ref 3.87–5.11)
RDW: 12.5 % (ref 11.5–15.5)
WBC: 14.6 10*3/uL — ABNORMAL HIGH (ref 4.0–10.5)
nRBC: 0 % (ref 0.0–0.2)

## 2019-11-06 LAB — URINE DRUG SCREEN, QUALITATIVE (ARMC ONLY)
Amphetamines, Ur Screen: NOT DETECTED
Barbiturates, Ur Screen: NOT DETECTED
Benzodiazepine, Ur Scrn: POSITIVE — AB
Cannabinoid 50 Ng, Ur ~~LOC~~: NOT DETECTED
Cocaine Metabolite,Ur ~~LOC~~: NOT DETECTED
MDMA (Ecstasy)Ur Screen: NOT DETECTED
Methadone Scn, Ur: NOT DETECTED
Opiate, Ur Screen: NOT DETECTED
Phencyclidine (PCP) Ur S: NOT DETECTED
Tricyclic, Ur Screen: NOT DETECTED

## 2019-11-06 LAB — HEMOGLOBIN A1C
Hgb A1c MFr Bld: 11.5 % — ABNORMAL HIGH (ref 4.8–5.6)
Mean Plasma Glucose: 283.35 mg/dL

## 2019-11-06 LAB — ETHANOL: Alcohol, Ethyl (B): <10 mg/dL (ref <10)

## 2019-11-06 LAB — URINALYSIS, COMPLETE (UACMP) WITH MICROSCOPIC
Bacteria, UA: NONE SEEN
Bilirubin Urine: NEGATIVE
Glucose, UA: >=500 mg/dL — AB
Ketones, ur: NEGATIVE mg/dL
Leukocytes,Ua: NEGATIVE
Nitrite: NEGATIVE
Protein, ur: >=300 mg/dL — AB
Specific Gravity, Urine: 1.023 (ref 1.005–1.030)
Squamous Epithelial / HPF: NONE SEEN (ref 0–5)
pH: 5 (ref 5.0–8.0)

## 2019-11-06 LAB — BETA-HYDROXYBUTYRIC ACID: Beta-Hydroxybutyric Acid: 0.28 mmol/L — ABNORMAL HIGH (ref 0.05–0.27)

## 2019-11-06 LAB — HIV ANTIBODY (ROUTINE TESTING W REFLEX): HIV Screen 4th Generation wRfx: NONREACTIVE

## 2019-11-06 LAB — SARS CORONAVIRUS 2 BY RT PCR (HOSPITAL ORDER, PERFORMED IN ~~LOC~~ HOSPITAL LAB): SARS Coronavirus 2: NEGATIVE

## 2019-11-06 MED ORDER — LORAZEPAM 2 MG/ML IJ SOLN
INTRAMUSCULAR | Status: AC
  Administered 2019-11-06: 1 mg via INTRAVENOUS
  Filled 2019-11-06: qty 1

## 2019-11-06 MED ORDER — ONDANSETRON HCL 4 MG/2ML IJ SOLN: 4.0000 mg | Freq: Four times a day (QID) | INTRAMUSCULAR | Status: DC | PRN

## 2019-11-06 MED ORDER — DEXTROSE 50 % IV SOLN: 0.0000 mL | INTRAVENOUS | Status: DC | PRN

## 2019-11-06 MED ORDER — SODIUM CHLORIDE 0.9 % IV SOLN: 75.0000 mL/h | INTRAVENOUS | Status: DC

## 2019-11-06 MED ORDER — LORAZEPAM 2 MG/ML IJ SOLN: 1.0000 mg | INTRAMUSCULAR | Status: DC | PRN

## 2019-11-06 MED ORDER — KETOROLAC TROMETHAMINE 30 MG/ML IJ SOLN
15.0000 mg | Freq: Once | INTRAMUSCULAR | Status: AC
  Administered 2019-11-06: 15 mg via INTRAVENOUS
  Filled 2019-11-06: qty 1

## 2019-11-06 MED ORDER — LEVETIRACETAM IN NACL 1000 MG/100ML IV SOLN
1000.0000 mg | Freq: Once | INTRAVENOUS | Status: AC
  Administered 2019-11-06: 1000 mg via INTRAVENOUS
  Filled 2019-11-06: qty 100

## 2019-11-06 MED ORDER — BENAZEPRIL HCL 20 MG PO TABS: 40.0000 mg | ORAL_TABLET | Freq: Every day | ORAL | Status: DC

## 2019-11-06 MED ORDER — INSULIN REGULAR(HUMAN) IN NACL 100-0.9 UT/100ML-% IV SOLN
INTRAVENOUS | Status: DC
  Administered 2019-11-06: 15 [IU]/h via INTRAVENOUS
  Filled 2019-11-06: qty 100

## 2019-11-06 MED ORDER — SODIUM CHLORIDE 0.9 % IV BOLUS
1000.0000 mL | Freq: Once | INTRAVENOUS | Status: AC
  Administered 2019-11-06: 1000 mL via INTRAVENOUS

## 2019-11-06 MED ORDER — INSULIN GLARGINE 100 UNIT/ML ~~LOC~~ SOLN
40.0000 [IU] | Freq: Every day | SUBCUTANEOUS | Status: DC
  Filled 2019-11-06: qty 0.4

## 2019-11-06 MED ORDER — MAGNESIUM SULFATE 2 GM/50ML IV SOLN
2.0000 g | Freq: Once | INTRAVENOUS | Status: AC
  Administered 2019-11-06: 2 g via INTRAVENOUS
  Filled 2019-11-06: qty 50

## 2019-11-06 MED ORDER — ACETAMINOPHEN 650 MG RE SUPP: 650.0000 mg | RECTAL | Status: DC | PRN

## 2019-11-06 MED ORDER — ENOXAPARIN SODIUM 40 MG/0.4ML ~~LOC~~ SOLN
40.0000 mg | Freq: Two times a day (BID) | SUBCUTANEOUS | Status: DC
  Administered 2019-11-06: 40 mg via SUBCUTANEOUS
  Filled 2019-11-06: qty 0.4

## 2019-11-06 MED ORDER — LEVETIRACETAM IN NACL 500 MG/100ML IV SOLN
500.0000 mg | Freq: Two times a day (BID) | INTRAVENOUS | Status: DC
  Administered 2019-11-06: 500 mg via INTRAVENOUS
  Filled 2019-11-06 (×3): qty 100

## 2019-11-06 MED ORDER — ONDANSETRON HCL 4 MG PO TABS: 4.0000 mg | ORAL_TABLET | Freq: Four times a day (QID) | ORAL | Status: DC | PRN

## 2019-11-06 MED ORDER — LACTATED RINGERS IV SOLN: INTRAVENOUS | Status: DC

## 2019-11-06 MED ORDER — LORAZEPAM 2 MG/ML IJ SOLN: 1.0000 mg | Freq: Once | INTRAMUSCULAR | Status: AC

## 2019-11-06 MED ORDER — DEXTROSE-NACL 5-0.45 % IV SOLN: INTRAVENOUS | Status: DC

## 2019-11-06 MED ORDER — ACETAMINOPHEN 325 MG PO TABS
650.0000 mg | ORAL_TABLET | ORAL | Status: DC | PRN
  Administered 2019-11-06: 650 mg via ORAL
  Filled 2019-11-06: qty 2

## 2019-11-06 NOTE — Consult Note (Signed)
NEUROLOGY CONSULTATION NOTE   Date of service: November 06, 2019 Patient Name: Kathryn Hogan MRN:  [id number] DOB:  [birth year] Reason for consult: "seizure"  History of Present Illness  Kathryn Hogan is a 49 y.o. female with PMH significant for poorly controlled Diabetes, HTN, OSA who presents with a witnessed seizure.  Per husband, at [place] on 11/06/19, patient got up in the middle of night with a headache, did not feel well, threw up in the bathroom, then sat down for a minute and wanted to take her a shower. Husband heard a bump, found her in the shower, twitching. She stopped shaking, eyes were closed, foaming at mouth, not responsive. He called EMS.  She was drinking some earlier in the evening, not a lot per husband. EMS found her glucose in 500s.  Patient endorses a prodrome of numbness in the L leg, she lost consciousness then and woke up in the ambulance.  Patient's glucose runs in 300s at baseline.  She had an episode of numbness, lost feeling in hand and leg a little bit and they thought that was possibly a seizure.  No prior hx of seizures, no prior CNS surgery, no prior CNS infections, no hx of significant trauma with LOC, no family hx of seizures. She had 2 drinks the evening before but does not drink often, occasionally drinks on social occasions.  In the ED, she had another witnessed GTC and was given Ativan 1 mg IV followed by Keppra 1000 mg IV.    ROS   Constitutional Denies weight loss, fever and chills.  HEENT Denies changes in vision and hearing.  Respiratory Denies SOB and cough.  CV Denies palpitations and CP  GI Denies abdominal pain, nausea, vomiting and diarrhea.  GU Denies dysuria and urinary frequency.  MSK Denies myalgia and joint pain.  Skin Denies rash and pruritus.  Neurological Denies headache and syncope.  Psychiatric Denies recent changes in mood. Denies anxiety and depression.   Past History   Past Medical History:  Diagnosis Date  .  Anxiety   . Diabetes mellitus without complication ([place])   . Hypertension   . Sleep apnea    does not use CPAP   Past Surgical History:  Procedure Laterality Date  . GAS/FLUID EXCHANGE Left 07/25/2017   Procedure: GAS/FLUID EXCHANGE;  Surgeon: Jalene Mullet, MD;  Location: [place];  Service: Ophthalmology;  Laterality: Left;  SF6  . PARS PLANA VITRECTOMY Left 07/25/2017   Procedure: PARS PLANA VITRECTOMY WITH 25 GAUGE LEFT EYE;  Surgeon: Jalene Mullet, MD;  Location: [place];  Service: Ophthalmology;  Laterality: Left;  . PARS PLANA VITRECTOMY Right 08/15/2017   Procedure: PARS PLANA VITRECTOMY WITH 25 GAUGE MEMBRANE PILL WITH AIR GAS SILICONE OIL AND PHOTOCOAGULATION  RIGHT;  Surgeon: Jalene Mullet, MD;  Location: [place];  Service: Ophthalmology;  Laterality: Right;  . PHOTOCOAGULATION WITH LASER Left 07/25/2017   Procedure: PHOTOCOAGULATION WITH LASER;  Surgeon: Jalene Mullet, MD;  Location: [place];  Service: Ophthalmology;  Laterality: Left;  . TUBAL LIGATION    . UMBILICAL HERNIA REPAIR N/A 08/02/2015   Procedure: HERNIA REPAIR UMBILICAL ADULT;  Surgeon: Christene Lye, MD;  Location: [facility];  Service: General;  Laterality: N/A;   Family History  Problem Relation Age of Onset  . Breast cancer Sister 67  . Esophageal cancer Mother   . Colon cancer Father   . Bone cancer Sister    Social History   Socioeconomic History  . Marital status: Married  Spouse name: Not on file  . Number of children: Not on file  . Years of education: Not on file  . Highest education level: Not on file  Occupational History  . Not on file  Tobacco Use  . Smoking status: Never Smoker  . Smokeless tobacco: Never Used  Vaping Use  . Vaping Use: Never used  Substance and Sexual Activity  . Alcohol use: Yes    Alcohol/week: 0.0 standard drinks    Comment: rare to occasional  . Drug use: Not Currently  . Sexual activity: Not on file  Other Topics Concern  . Not on file  Social History  Narrative  . Not on file   Social Determinants of Health   Financial Resource Strain:   . Difficulty of Paying Living Expenses: Not on file  Food Insecurity:   . Worried About Charity fundraiser in the Last Year: Not on file  . Ran Out of Food in the Last Year: Not on file  Transportation Needs:   . Lack of Transportation (Medical): Not on file  . Lack of Transportation (Non-Medical): Not on file  Physical Activity:   . Days of Exercise per Week: Not on file  . Minutes of Exercise per Session: Not on file  Stress:   . Feeling of Stress : Not on file  Social Connections:   . Frequency of Communication with Friends and Family: Not on file  . Frequency of Social Gatherings with Friends and Family: Not on file  . Attends Religious Services: Not on file  . Active Member of Clubs or Organizations: Not on file  . Attends Archivist Meetings: Not on file  . Marital Status: Not on file   No Known Allergies  Medications  (Not in a hospital admission)    Vitals  Temp:  [99.6 F (37.6 C)] 99.6 F (37.6 C) (09/18 0259) Pulse Rate:  [99-109] 99 (09/18 0852) Resp:  [16-23] 18 (09/18 0852) BP: (147-166)/(75-92) 147/75 (09/18 0725) SpO2:  [92 %-98 %] 95 % (09/18 0852) Weight:  [142.9 kg] 142.9 kg (09/18 0300)  Body mass index is 59.52 kg/m.  Physical Exam   General: Laying comfortably in bed; in no acute distress.  HENT: Normal oropharynx and mucosa. Normal external appearance of ears and nose. Neck: Supple, no pain or tenderness CV: No JVD. No peripheral edema. Pulmonary: Symmetric Chest rise. Normal respiratory effort. Abdomen: Soft to touch, non-tender Ext: No cyanosis, edema, or deformity  Skin: No rash. Normal palpation of skin.   Musculoskeletal: Normal digits and nails by inspection. No clubbing.  Neurologic Examination  Mental status/Cognition: Alert, oriented to self, place, month and year, good attention. Speech/language: Fluent, comprehension intact,  object naming intact, repetition intact. Cranial nerves:   CN II Pupils equal and reactive to light, visual acuity is reduced BL due to known cataracts for which she follows with ophthalmology.   CN III,IV,VI EOM intact, no gaze preference or deviation, no nystagmus   CN V normal sensation in V1, V2, and V3 segments bilaterally   CN VII no asymmetry, no nasolabial fold flattening   CN VIII normal hearing to speech   CN IX & X normal palatal elevation, no uvular deviation    CN XI 5/5 head turn and 5/5 shoulder shrug bilaterally    CN XII midline tongue protrusion    Motor:  Muscle bulk: normal, tone normal, pronator drift none Mvmt Root Nerve  Muscle Right Left Comments  SA C5/6 Ax Deltoid 5 5  EF C5/6 Mc Biceps 5 5   EE C6/7/8 Rad Triceps 5 5   WF C6/7 Med FCR 5 5   WE C7/8 PIN ECU 5 5   F Ab C8/T1 U ADM/FDI 5 5   HF L1/2/3 Fem Illopsoas 5 5   KE L2/3/4 Fem Quad 5 5   DF L4/5 D Peron Tib Ant 5 5   PF S1/2 Tibial Grc/Sol 5 5    Reflexes:  Right Left Comments  Pectoralis      Biceps (C5/6) 1 1   Brachioradialis (C5/6) 1 1    Triceps (C6/7) 1 1    Patellar (L3/4) 0 0    Achilles (S1) 0 0    Hoffman      Plantar     Jaw jerk    Sensation:  Light touch Intact throughout   Pin prick    Temperature    Vibration   Proprioception    Coordination/Complex Motor:  - Finger to Nose intact BL - Heel to shin intact BL - Rapid alternating movement normal - Gait: deferred.  Labs   Lab Results  Component Value Date   NA 132 (L) 11/06/2019   K 3.8 11/06/2019   CL 94 (L) 11/06/2019   CO2 25 11/06/2019   GLUCOSE 590 (HH) 11/06/2019   BUN 18 11/06/2019   CREATININE 1.08 (H) 11/06/2019   CALCIUM 8.7 (L) 11/06/2019   ALBUMIN 3.2 (L) 11/06/2019   AST 22 11/06/2019   ALT 17 11/06/2019   ALKPHOS 95 11/06/2019   BILITOT 0.7 11/06/2019   GFRNONAA 59 (L) 11/06/2019   GFRAA >60 11/06/2019     Imaging and Diagnostic studies  CTH without contrast: 11/06/19: Normal head  CT.  MRI Brain without contrast on 09/13/19: No evidence of acute intracranial abnormality, including acute infarction. Mild chronic small vessel ischemic changes within the cerebral white matter. Signal abnormality within the right globe which is likely postprocedural. However, correlate with procedural history.  Impression   Kathryn Hogan is a 49 y.o. female with PMH significant for poorly controlled Diabetes, HTN, OSA who presents with a witnessed seizure x 2 in the setting of hyperglycemia. . Her neurologic examination is notable for decreased visual acuity BL which is chronic and known per patient and she is followed by ophthalmology. She has no focal deficit on exam.  As for her seizures, I do think theses were provoked in the setting of hyperglycemia. Workup with imaging with CTH was negative. Rcenet MRI Brain without contrast about 2 months ago is also negative and given no focal deficit, I do not think that a repeat MRI at this time is going to be helpful.  Unfortunately we do not have routine EEGs available over the weekend and therefore somewhat difficult to assess if she has any epileptogenic abnormalities putting her at risk of seizures in the future. It would be unreasonable to transfer her to higher level of care or keep her over the weekend for a routine EEG.  The best approach right now is to discharge her and have a routine EEG done outpatient with follow up with neurology outpatient. We should send her out with rescue midazolam. I discussed with patient and husband that if she has any seizure like activity lasting more than 5 mins, that he should go ahead and give her Intranasal midazolam and call 911. Seizure acitivity lasting more than 5 mins is less likely to spontaneously stop by itself.  With the Covid situation, I don't think she needs  transfer to a higher level of care for a routine EEG.  Also discussed the importance of glucose control. Hyperglycemia can trigger a  seizure.  Recommendations  - routine EEG within 1 week - Follow up with neurology in a few days after rEEG. - No need for an MRI Brain at this point, recent MRI Brain is negative and she does not have any focal deficit on exam. - Discussed with patient and husband that patient should not drive, not use any heavy equipment, no swimming, no working with or around sharp or hot objects or fires for at least 6 months until she ius cleared by neurology outpatient. - I ordered a prescription of Intranasal midazolam metered spray of 0.1 mL containing 0.5 mg (a solution of 5 mg/mL), three to five times, per nostril, and repeated if necessary, to a total dose of 10 mg for adults. Discussed with husband and asked him to use it in case of seizure activity lasting more than 5 mins. Any seizures lasting over 5 mins are less likely to stop by themselves. He should call 911 if seizure goes on for more than 5 mins.  ______________________________________________________________________   Thank you for the opportunity to take part in the care of this patient. If you have any further questions, please contact the neurology consultation attending.  Signed,  Donnetta Simpers [facility] Pager Number [phone]  Seizure precautions for discharge Seizure precautions: Per [facility] statutes, patients with seizures are not allowed to drive until they have been seizure-free for six months and cleared by a physician    Use caution when using heavy equipment or power tools. Avoid working on ladders or at heights. Take showers instead of baths. Ensure the water temperature is not too high on the home water heater. Do not go swimming alone. Do not lock yourself in a room alone (i.e. bathroom). When caring for infants or small children, sit down when holding, feeding, or changing them to minimize risk of injury to the child in the event you have a seizure. Maintain good sleep hygiene. Avoid alcohol.     If patient has another seizure, call 911 and bring them back to the ED if: A.  The seizure lasts longer than 5 minutes.      B.  The patient doesn't wake shortly after the seizure or has new problems such as difficulty seeing, speaking or moving following the seizure C.  The patient was injured during the seizure D.  The patient has a temperature over 102 F (39C) E.  The patient vomited during the seizure and now is having trouble breathing    During the Seizure   - First, ensure adequate ventilation and place patients on the floor on their left side  Loosen clothing around the neck and ensure the airway is patent. If the patient is clenching the teeth, do not force the mouth open with any object as this can cause severe damage - Remove all items from the surrounding that can be hazardous. The patient may be oblivious to what's happening and may not even know what he or she is doing. If the patient is confused and wandering, either gently guide him/her away and block access to outside areas - Reassure the individual and be comforting - Call 911. In most cases, the seizure ends before EMS arrives. However, there are cases when seizures may last over 3 to 5 minutes. Or the individual may have developed breathing difficulties or severe injuries. If a pregnant patient  or a person with diabetes develops a seizure, it is prudent to call an ambulance. - Finally, if the patient does not regain full consciousness, then call EMS. Most patients will remain confused for about 45 to 90 minutes after a seizure, so you must use judgment in calling for help. - Avoid restraints but make sure the patient is in a bed with padded side rails - Place the individual in a lateral position with the neck slightly flexed; this will help the saliva drain from the mouth and prevent the tongue from falling backward - Remove all nearby furniture and other hazards from the area - Provide verbal assurance as the individual  is regaining consciousness - Provide the patient with privacy if possible - Call for help and start treatment as ordered by the caregiver    After the Seizure (Postictal Stage)   After a seizure, most patients experience confusion, fatigue, muscle pain and/or a headache. Thus, one should permit the individual to sleep. For the next few days, reassurance is essential. Being calm and helping reorient the person is also of importance.   Most seizures are painless and end spontaneously. Seizures are not harmful to others but can lead to complications such as stress on the lungs, brain and the heart. Individuals with prior lung problems may develop labored breathing and respiratory distress.

## 2019-11-06 NOTE — ED Notes (Signed)
bg [phone]

## 2019-11-06 NOTE — ED Notes (Signed)
Magnesium sulfate delayed due to loss of IV access. Will start once IV access is obtained

## 2019-11-06 NOTE — ED Notes (Signed)
bg [phone]

## 2019-11-06 NOTE — ED Notes (Signed)
Pt continually reminded to keep her arm straight to allow insulin to continue to infuse. This RN requested pt to call out when IV starts to beep d/t pt not getting sufficient insulin if the IV pauses.

## 2019-11-06 NOTE — ED Provider Notes (Signed)
[facility] Emergency Department Provider Note  ____________________________________________   First MD Initiated Contact with Patient 11/06/19 0300     (approximate)  I have reviewed the triage vital signs and the nursing notes.   HISTORY  Chief Complaint Possible seizure   HPI Kathryn Hogan is a 49 y.o. female with below list of previous medical conditions including hypertension diabetes and sleep apnea presents to the emergency department via EMS following a witnessed generalized tonic-clonic seizure at home by her family.  EMS states that the patient has had EtOH ingestion tonight.  Patient on arrival admits to having 1 glass of wine and 1 shot of alcohol earlier in the evening.  Patient's husband presented to the emergency department and inform me that his wife had been complaining of a headache today before witnessed seizure activity by himself.  Patient denies any recent illness.        Past Medical History:  Diagnosis Date  . Anxiety   . Diabetes mellitus without complication ([place])   . Hypertension   . Sleep apnea    does not use CPAP    Patient Active Problem List   Diagnosis Date Noted  . Hyperglycemia due to type 2 diabetes mellitus ([place]) 11/06/2019  . Recurrent seizures ([place]) 11/06/2019  . HTN (hypertension) 11/06/2019  . Morbid obesity with BMI of 50.0-59.9, adult ([place]) 11/06/2019  . New onset seizure ([place]) 11/06/2019  . SIRS (systemic inflammatory response syndrome) ([place]) 11/06/2019    Past Surgical History:  Procedure Laterality Date  . GAS/FLUID EXCHANGE Left 07/25/2017   Procedure: GAS/FLUID EXCHANGE;  Surgeon: Jalene Mullet, MD;  Location: [place];  Service: Ophthalmology;  Laterality: Left;  SF6  . PARS PLANA VITRECTOMY Left 07/25/2017   Procedure: PARS PLANA VITRECTOMY WITH 25 GAUGE LEFT EYE;  Surgeon: Jalene Mullet, MD;  Location: [place];  Service: Ophthalmology;  Laterality: Left;  . PARS PLANA VITRECTOMY Right 08/15/2017     Procedure: PARS PLANA VITRECTOMY WITH 25 GAUGE MEMBRANE PILL WITH AIR GAS SILICONE OIL AND PHOTOCOAGULATION  RIGHT;  Surgeon: Jalene Mullet, MD;  Location: [place];  Service: Ophthalmology;  Laterality: Right;  . PHOTOCOAGULATION WITH LASER Left 07/25/2017   Procedure: PHOTOCOAGULATION WITH LASER;  Surgeon: Jalene Mullet, MD;  Location: [place];  Service: Ophthalmology;  Laterality: Left;  . TUBAL LIGATION    . UMBILICAL HERNIA REPAIR N/A 08/02/2015   Procedure: HERNIA REPAIR UMBILICAL ADULT;  Surgeon: Christene Lye, MD;  Location: [facility];  Service: General;  Laterality: N/A;    Prior to Admission medications   Medication Sig Start Date End Date Taking? Authorizing Provider  acetaminophen (TYLENOL) 500 MG tablet Take 1,000 mg by mouth every 6 (six) hours as needed for moderate pain.    [provider]  amLODipine (NORVASC) 5 MG tablet Take 1 tablet (5 mg total) by mouth daily. Patient not taking: Reported on 07/24/2017 07/14/16   Sherwood Gambler, MD  benazepril (LOTENSIN) 40 MG tablet Take 1 tablet (40 mg total) by mouth daily. 09/16/19   Carlisle Cater, PA-C  clindamycin (CLEOCIN) 300 MG capsule Take 1 capsule (300 mg total) by mouth 4 (four) times daily. Patient not taking: Reported on 07/24/2017 03/24/17   Cuthriell, Charline Bills, PA-C  hydrochlorothiazide (HYDRODIURIL) 25 MG tablet Take 1 tablet (25 mg total) by mouth daily. Patient not taking: Reported on 09/16/2019 07/14/16   Sherwood Gambler, MD  HYDROcodone-acetaminophen (NORCO/VICODIN) 5-325 MG tablet Take 1 tablet by mouth every 4 (four) hours as needed for  moderate pain. Patient not taking: Reported on 07/24/2017 03/24/17   Cuthriell, Charline Bills, PA-C  ibuprofen (ADVIL) 200 MG tablet Take 400 mg by mouth daily as needed for moderate pain.    [provider]  Insulin Detemir (LEVEMIR) 100 UNIT/ML Pen Inject 20 Units into the skin 2 (two) times daily. Patient not taking: Reported on 09/16/2019 07/14/16   Sherwood Gambler, MD   insulin glargine (LANTUS) 100 unit/mL SOPN Inject 40 Units into the skin daily.    [provider]  metFORMIN (GLUCOPHAGE) 500 MG tablet Take 2 tablets (1,000 mg total) by mouth 2 (two) times daily with a meal. Patient not taking: Reported on 07/24/2017 07/14/16   Sherwood Gambler, MD  metoprolol succinate (TOPROL-XL) 100 MG 24 hr tablet Take 100 mg by mouth daily. Take with or immediately following a meal.    [provider]  polyethylene glycol powder (GLYCOLAX/MIRALAX) powder Take 255 g by mouth once. Patient not taking: Reported on 07/24/2017 09/07/15   Christene Lye, MD  Semaglutide,0.25 or 0.5MG /DOS, (OZEMPIC, 0.25 OR 0.5 MG/DOSE,) 2 MG/1.5ML SOPN Inject 0.25 mg into the skin once a week. Administer 0.25mg  once a week for 4 weeks, then increase dose to 0.5mg  weekly. 01/04/19   [provider]  torsemide (DEMADEX) 20 MG tablet Take 20 mg by mouth 2 (two) times daily. 12/02/18   [provider]    Allergies Patient has no known allergies.  Family History  Problem Relation Age of Onset  . Breast cancer Sister 78  . Esophageal cancer Mother   . Colon cancer Father   . Bone cancer Sister     Social History Social History   Tobacco Use  . Smoking status: Never Smoker  . Smokeless tobacco: Never Used  Vaping Use  . Vaping Use: Never used  Substance Use Topics  . Alcohol use: Yes    Alcohol/week: 0.0 standard drinks    Comment: rare to occasional  . Drug use: Not Currently    Review of Systems Constitutional: No fever/chills Eyes: No visual changes. ENT: No sore throat. Cardiovascular: Denies chest pain. Respiratory: Denies shortness of breath. Gastrointestinal: No abdominal pain.  No nausea, no vomiting.  No diarrhea.  No constipation. Genitourinary: Negative for dysuria. Musculoskeletal: Negative for neck pain.  Negative for back pain. Integumentary: Negative for rash. Neurological: Positive for headache and witnessed seizure  activity ____________________________________________   PHYSICAL EXAM:  VITAL SIGNS: ED Triage Vitals  Enc Vitals Group     BP 11/06/19 0259 (!) 166/92     Pulse Rate 11/06/19 0259 (!) 109     Resp 11/06/19 0259 (!) 23     Temp 11/06/19 0259 99.6 F (37.6 C)     Temp Source 11/06/19 0259 Oral     SpO2 11/06/19 0259 92 %     Weight 11/06/19 0300 (!) 142.9 kg (315 lb)     Height 11/06/19 0300 1.549 m (5\' 1" )     Head Circumference --      Peak Flow --      Pain Score 11/06/19 0300 0     Pain Loc --      Pain Edu? --      Excl. in [place]? --     Constitutional: Alert and oriented.  Eyes: Conjunctivae are normal.  Head: Atraumatic. Mouth/Throat: Patient is wearing a mask. Neck: No stridor.  No meningeal signs.   Cardiovascular: Normal rate, regular rhythm. Good peripheral circulation. Grossly normal heart sounds. Respiratory: Normal respiratory effort.  No  retractions. Gastrointestinal: Soft and nontender. No distention.  Musculoskeletal: No lower extremity tenderness nor edema. No gross deformities of extremities. Neurologic:  Normal speech and language. No gross focal neurologic deficits are appreciated.  Skin:  Skin is warm, dry and intact. Psychiatric: Mood and affect are normal. Speech and behavior are normal.  ____________________________________________   LABS (all labs ordered are listed, but only abnormal results are displayed)  Labs Reviewed  CBC - Abnormal; Notable for the following components:      Result Value   WBC 14.6 (*)    MCV 78.5 (*)    All other components within normal limits  COMPREHENSIVE METABOLIC PANEL - Abnormal; Notable for the following components:   Sodium 132 (*)    Chloride 94 (*)    Glucose, Bld 590 (*)    Creatinine, Ser 1.08 (*)    Calcium 8.7 (*)    Albumin 3.2 (*)    GFR calc non Af Amer 59 (*)    All other components within normal limits  BETA-HYDROXYBUTYRIC ACID - Abnormal; Notable for the following components:    Beta-Hydroxybutyric Acid 0.28 (*)    All other components within normal limits  SARS CORONAVIRUS 2 BY RT PCR (HOSPITAL ORDER, [facility])  ETHANOL  URINALYSIS, COMPLETE (UACMP) WITH MICROSCOPIC  URINE DRUG SCREEN, QUALITATIVE ([facility] ONLY)  HIV ANTIBODY (ROUTINE TESTING W REFLEX)  HEMOGLOBIN A1C  OSMOLALITY  URINALYSIS, ROUTINE W REFLEX MICROSCOPIC   ____________________________________________  EKG ED ECG REPORT I, [place] N Chaka Jefferys, the attending physician, personally viewed and interpreted this ECG.   Date: 11/06/2019  EKG Time: 2:56 AM  Rate: 109  Rhythm: Sinus tachycardia  Axis: Normal  Intervals: Normal  ST&T Change: None   ____________________________________________  RADIOLOGY I, [place] N Valeria Krisko, personally viewed and evaluated these images (plain radiographs) as part of my medical decision making, as well as reviewing the written report by the radiologist.  ED MD interpretation: Normal head CT per radiologist.  Official radiology report(s): CT Head Wo Contrast  Result Date: 11/06/2019 CLINICAL DATA:  Encephalopathy EXAM: CT HEAD WITHOUT CONTRAST TECHNIQUE: Contiguous axial images were obtained from the base of the skull through the vertex without intravenous contrast. COMPARISON:  09/16/2019 FINDINGS: Brain: There is no mass, hemorrhage or extra-axial collection. The size and configuration of the ventricles and extra-axial CSF spaces are normal. The brain parenchyma is normal, without acute or chronic infarction. Vascular: No abnormal hyperdensity of the major intracranial arteries or dural venous sinuses. No intracranial atherosclerosis. Skull: The visualized skull base, calvarium and extracranial soft tissues are normal. Sinuses/Orbits: No fluid levels or advanced mucosal thickening of the visualized paranasal sinuses. No mastoid or middle ear effusion. Chronic right globe hyperattenuation. IMPRESSION: Normal head CT. Electronically Signed   By:  Ulyses Jarred M.D.   On: 11/06/2019 03:46   DG Chest Portable 1 View  Result Date: 11/06/2019 CLINICAL DATA:  Seizure. EXAM: PORTABLE CHEST 1 VIEW COMPARISON:  None FINDINGS: Cardiac enlargement. Decreased lung volumes with mild asymmetric elevation of right hemidiaphragm no pleural effusion, interstitial edema or airspace consolidation. Visualized osseous structures are unremarkable. IMPRESSION: 1. No acute cardiopulmonary abnormalities. 2. Cardiac enlargement. Electronically Signed   By: Kerby Moors M.D.   On: 11/06/2019 04:24      Procedures   ____________________________________________   INITIAL IMPRESSION / MDM / [place] / ED COURSE  As part of my medical decision making, I reviewed the following data within the electronic MEDICAL RECORD NUMBER   49 year old  female presented with above-stated history and physical exam secondary to witnessed seizure-like activity.  Patient went to have a CT scan of the head performed and had another witnessed generalized tonic-clonic seizure by myself and staff.  Patient was given Ativan 1 mg IV followed by Keppra 1000 mg IV.  Patient now appears postictal at this time.  CT scan of the head revealed no acute intracranial abnormality.  Review of the patient's chart revealed a negative MRI brain on 09/16/2019.  Patient's laboratory data notable for glucose of 590 with a normal anion gap.  Patient discussed with Dr. Damita Dunnings for admission for further evaluation and management.  _____________  FINAL CLINICAL IMPRESSION(S) / ED DIAGNOSES  Final diagnoses:  New onset seizure ([place])     MEDICATIONS GIVEN DURING THIS VISIT:  Medications  0.9 %  sodium chloride infusion (has no administration in time range)  enoxaparin (LOVENOX) injection 40 mg (has no administration in time range)  levETIRAcetam (KEPPRA) IVPB 500 mg/100 mL premix (has no administration in time range)  LORazepam (ATIVAN) injection 1-2 mg (has no administration in time range)    acetaminophen (TYLENOL) tablet 650 mg (has no administration in time range)    Or  acetaminophen (TYLENOL) suppository 650 mg (has no administration in time range)  ondansetron (ZOFRAN) tablet 4 mg (has no administration in time range)    Or  ondansetron (ZOFRAN) injection 4 mg (has no administration in time range)  insulin regular, human (MYXREDLIN) 100 units/ 100 mL infusion (has no administration in time range)  lactated ringers infusion (has no administration in time range)  dextrose 50 % solution 0-50 mL (has no administration in time range)  dextrose 5 %-0.45 % sodium chloride infusion (has no administration in time range)  LORazepam (ATIVAN) injection 1 mg (1 mg Intravenous Given 11/06/19 0348)  levETIRAcetam (KEPPRA) IVPB 1000 mg/100 mL premix (0 mg Intravenous Stopped 11/06/19 0439)  sodium chloride 0.9 % bolus 1,000 mL (1,000 mLs Intravenous New Bag/Given 11/06/19 0437)  sodium chloride 0.9 % bolus 1,000 mL (1,000 mLs Intravenous New Bag/Given 11/06/19 0439)     ED Discharge Orders    None      *Please note:  Kathryn Hogan was evaluated in Emergency Department on 11/06/2019 for the symptoms described in the history of present illness. She was evaluated in the context of the global COVID-19 pandemic, which necessitated consideration that the patient might be at risk for infection with the SARS-CoV-2 virus that causes COVID-19. Institutional protocols and algorithms that pertain to the evaluation of patients at risk for COVID-19 are in a state of rapid change based on information released by regulatory bodies including the CDC and federal and state organizations. These policies and algorithms were followed during the patient's care in the ED.  Some ED evaluations and interventions may be delayed as a result of limited staffing during and after the pandemic.*  Note:  This document was prepared using Dragon voice recognition software and may include unintentional dictation errors.    Gregor Hams, MD 11/06/19 [phone]

## 2019-11-06 NOTE — ED Notes (Signed)
bg 222

## 2019-11-06 NOTE — ED Notes (Signed)
bg 150

## 2019-11-06 NOTE — Progress Notes (Signed)
PHARMACIST - PHYSICIAN COMMUNICATION  CONCERNING:  Enoxaparin (Lovenox) for DVT Prophylaxis    RECOMMENDATION: Patient was prescribed enoxaprin 40mg  q24 hours for VTE prophylaxis.   Filed Weights   11/06/19 0300  Weight: (!) 142.9 kg (315 lb)    Body mass index is 59.52 kg/m.  Estimated Creatinine Clearance: 83.5 mL/min (A) (by C-G formula based on SCr of 1.08 mg/dL (H)).   Based on [place] patient is candidate for enoxaparin 40mg  every 12 hours due to BMI being >40.  DESCRIPTION: Pharmacy has adjusted enoxaparin dose per [facility] policy.  Patient is now receiving enoxaparin _40___mg every _12__ hours    Ena Dawley, PharmD Clinical Pharmacist  11/06/2019 6:33 AM

## 2019-11-06 NOTE — ED Triage Notes (Addendum)
Pt to ED from home via [facility]. Pt physically combative with EMS and family after family witnessed possible seizure (episode of shaking), unknown how long. Pt denies hx of the same. Pt admits to drinking one glass of wine and one shot of bourbon. Pt cooperative at this time.   EMG cbg 459  Pt received 5mg  IM Versed

## 2019-11-06 NOTE — ED Notes (Signed)
BG 292

## 2019-11-06 NOTE — ED Notes (Signed)
bg 118

## 2019-11-06 NOTE — ED Notes (Signed)
Pt sitting up in bed, alert, eating lunch with husband's assistance.

## 2019-11-06 NOTE — ED Notes (Signed)
Per endo tool next bg in 2 hours

## 2019-11-06 NOTE — ED Notes (Signed)
bg 291

## 2019-11-06 NOTE — ED Notes (Signed)
MD in room. Family in room with pt

## 2019-11-06 NOTE — ED Notes (Signed)
Per endo tool pause IV insulin and recheck bg in one hour.

## 2019-11-06 NOTE — Progress Notes (Signed)
Inpatient Diabetes Program Recommendations  AACE/ADA: New Consensus Statement on Inpatient Glycemic Control (2015)  Target Ranges:  Prepandial:   less than 140 mg/dL      Peak postprandial:   less than 180 mg/dL (1-2 hours)      Critically ill patients:  140 - 180 mg/dL   Lab Results  Component Value Date   GLUCAP 158 (H) 11/06/2019   HGBA1C 12.6 (H) 07/25/2017    Review of Glycemic Control  Diabetes history: DM2 Outpatient Diabetes medications: Lantus 40 units qd + Metformin 1 gm bid + Ozempic 0.25 mg qweek Current orders for Inpatient glycemic control: IV insulin drip per endotool  Inpatient Diabetes Program Recommendations:   When patient meets criteria for transition: -Give Lantus 30 (0.2 units/kg x 142.9 kg) units 2 hrs prior to discontinuing of IV  insulin drip -Administer Novolog correction moderate 0-15 @ time of IV insulin discontinued Then tid ac meals & hs 0-5 units -Add Novolog 5 units tid meal coverage if eats 50% meals  Noted pending A1c. Last documented A1c in Epic was 07/25/2017 @ 12.6. Will follow and plan to speak with patient.  Thank you, Nani Gasser. Taber Sweetser, RN, MSN, CDE  Diabetes Coordinator Inpatient Glycemic Control Team Team Pager [phone] (8am-5pm) 11/06/2019 12:03 PM

## 2019-11-06 NOTE — ED Notes (Signed)
bg 308

## 2019-11-06 NOTE — H&P (Signed)
History and Physical    Kathryn Hogan ZOX:[id number] DOB: Jan 29, 1971 DOA: 11/06/2019  PCP: [facility]   Patient coming from: home  I have personally briefly reviewed patient's old medical records in [place]  Chief Complaint: Seizure  HPI: Kathryn Hogan is a 49 y.o. female with medical history significant for diabetes, hypertension, sleep apnea and morbid obesity, who was brought to the ER by EMS after seizure-like activity was witnessed by her husband.  Patient has no prior history of seizures.  No history of excessive alcohol use.  Had complained of a headache prior to seizure.  Had previously been in her usual state of health with no reports of cough, shortness of breath fever or chills, no chest pain, no nausea or vomiting, abdominal pain or change in bowel habits or dysuria and no weakness.  Most of history taken from husband at bedside.  Chart review reveals that patient had an MRI a couple months prior after presenting to the emergency room with headache ED Course: On arrival she was postictal but arousable, tachypneic at 23, tachycardic at 109 with low-grade temperature of 99.6, blood pressure 166/92.  Blood work notable for blood glucose of 590 with normal anion gap with WBC of 14,000.  EtOH level less than 10.  Creatinine 1.08.  Covid PCR pending.  Chest x-ray with no acute findings.  While in CT patient had a generalized tonic-clonic seizure aborted with Ativan.  CT head resulted negative.  Patient was loaded with Keppra.  Hospitalist consulted for admission.  Pending MRI MRV ordered from the ER  Review of Systems: Unable to obtain due to somnolence from recent Ativan administration   Past Medical History:  Diagnosis Date  . Anxiety   . Diabetes mellitus without complication ([place])   . Hypertension   . Sleep apnea    does not use CPAP    Past Surgical History:  Procedure Laterality Date  . GAS/FLUID EXCHANGE Left 07/25/2017   Procedure: GAS/FLUID  EXCHANGE;  Surgeon: Jalene Mullet, MD;  Location: [facility];  Service: Ophthalmology;  Laterality: Left;  SF6  . PARS PLANA VITRECTOMY Left 07/25/2017   Procedure: PARS PLANA VITRECTOMY WITH 25 GAUGE LEFT EYE;  Surgeon: Jalene Mullet, MD;  Location: [place];  Service: Ophthalmology;  Laterality: Left;  . PARS PLANA VITRECTOMY Right 08/15/2017   Procedure: PARS PLANA VITRECTOMY WITH 25 GAUGE MEMBRANE PILL WITH AIR GAS SILICONE OIL AND PHOTOCOAGULATION  RIGHT;  Surgeon: Jalene Mullet, MD;  Location: [place];  Service: Ophthalmology;  Laterality: Right;  . PHOTOCOAGULATION WITH LASER Left 07/25/2017   Procedure: PHOTOCOAGULATION WITH LASER;  Surgeon: Jalene Mullet, MD;  Location: [place];  Service: Ophthalmology;  Laterality: Left;  . TUBAL LIGATION    . UMBILICAL HERNIA REPAIR N/A 08/02/2015   Procedure: HERNIA REPAIR UMBILICAL ADULT;  Surgeon: Christene Lye, MD;  Location: [facility];  Service: General;  Laterality: N/A;     reports that she has never smoked. She has never used smokeless tobacco. She reports current alcohol use. She reports previous drug use.  No Known Allergies  Family History  Problem Relation Age of Onset  . Breast cancer Sister 38  . Esophageal cancer Mother   . Colon cancer Father   . Bone cancer Sister       Prior to Admission medications   Medication Sig Start Date End Date Taking? Authorizing Provider  acetaminophen (TYLENOL) 500 MG tablet Take 1,000 mg by mouth every 6 (six) hours as needed  for moderate pain.    [provider]  amLODipine (NORVASC) 5 MG tablet Take 1 tablet (5 mg total) by mouth daily. Patient not taking: Reported on 07/24/2017 07/14/16   Sherwood Gambler, MD  benazepril (LOTENSIN) 40 MG tablet Take 1 tablet (40 mg total) by mouth daily. 09/16/19   Carlisle Cater, PA-C  clindamycin (CLEOCIN) 300 MG capsule Take 1 capsule (300 mg total) by mouth 4 (four) times daily. Patient not taking: Reported on 07/24/2017 03/24/17   Cuthriell, Charline Bills,  PA-C  hydrochlorothiazide (HYDRODIURIL) 25 MG tablet Take 1 tablet (25 mg total) by mouth daily. Patient not taking: Reported on 09/16/2019 07/14/16   Sherwood Gambler, MD  HYDROcodone-acetaminophen (NORCO/VICODIN) 5-325 MG tablet Take 1 tablet by mouth every 4 (four) hours as needed for moderate pain. Patient not taking: Reported on 07/24/2017 03/24/17   Cuthriell, Charline Bills, PA-C  ibuprofen (ADVIL) 200 MG tablet Take 400 mg by mouth daily as needed for moderate pain.    [provider]  Insulin Detemir (LEVEMIR) 100 UNIT/ML Pen Inject 20 Units into the skin 2 (two) times daily. Patient not taking: Reported on 09/16/2019 07/14/16   Sherwood Gambler, MD  insulin glargine (LANTUS) 100 unit/mL SOPN Inject 40 Units into the skin daily.    [provider]  metFORMIN (GLUCOPHAGE) 500 MG tablet Take 2 tablets (1,000 mg total) by mouth 2 (two) times daily with a meal. Patient not taking: Reported on 07/24/2017 07/14/16   Sherwood Gambler, MD  metoprolol succinate (TOPROL-XL) 100 MG 24 hr tablet Take 100 mg by mouth daily. Take with or immediately following a meal.    [provider]  polyethylene glycol powder (GLYCOLAX/MIRALAX) powder Take 255 g by mouth once. Patient not taking: Reported on 07/24/2017 09/07/15   Christene Lye, MD  Semaglutide,0.25 or 0.5MG /DOS, (OZEMPIC, 0.25 OR 0.5 MG/DOSE,) 2 MG/1.5ML SOPN Inject 0.25 mg into the skin once a week. Administer 0.25mg  once a week for 4 weeks, then increase dose to 0.5mg  weekly. 01/04/19   [provider]  torsemide (DEMADEX) 20 MG tablet Take 20 mg by mouth 2 (two) times daily. 12/02/18   [provider]    Physical Exam: Vitals:   11/06/19 0259 11/06/19 0300  BP: (!) 166/92   Pulse: (!) 109   Resp: (!) 23   Temp: 99.6 F (37.6 C)   TempSrc: Oral   SpO2: 92%   Weight:  (!) 142.9 kg  Height:  5\' 1"  (1.549 m)     Vitals:   11/06/19 0259 11/06/19 0300  BP: (!) 166/92   Pulse: (!) 109   Resp: (!) 23     Temp: 99.6 F (37.6 C)   TempSrc: Oral   SpO2: 92%   Weight:  (!) 142.9 kg  Height:  5\' 1"  (1.549 m)      Constitutional:  Somnolent with snoring respiration, arousable with shaking.  Ativan was recently administered HEENT:      Head: Normocephalic and atraumatic.         Eyes: PERLA, EOMI, Conjunctivae are normal. Sclera is non-icteric.       Mouth/Throat: Mucous membranes are moist.       Neck: Supple with no signs of meningismus. Cardiovascular: Regular rate and rhythm. No murmurs, gallops, or rubs. 2+ symmetrical distal pulses are present . No JVD. No LE edema Respiratory: Respiratory effort normal .Lungs sounds clear bilaterally. No wheezes, crackles, or rhonchi.  Gastrointestinal: Soft, non tender, and non distended with positive bowel sounds. No rebound or  guarding. Genitourinary: No CVA tenderness. Musculoskeletal: Nontender with normal range of motion in all extremities. No cyanosis, or erythema of extremities. Neurologic: Somnolent but no gross deficit. Skin: Skin is warm, dry.  No rash or ulcers  Labs on Admission: I have personally reviewed following labs and imaging studies  CBC: Recent Labs  Lab 11/06/19 0311  WBC 14.6*  HGB 13.8  HCT 39.7  MCV 78.5*  PLT 092   Basic Metabolic Panel: Recent Labs  Lab 11/06/19 0311  NA 132*  K 3.8  CL 94*  CO2 25  GLUCOSE 590*  BUN 18  CREATININE 1.08*  CALCIUM 8.7*   GFR: Estimated Creatinine Clearance: 83.5 mL/min (A) (by C-G formula based on SCr of 1.08 mg/dL (H)). Liver Function Tests: Recent Labs  Lab 11/06/19 0311  AST 22  ALT 17  ALKPHOS 95  BILITOT 0.7  PROT 6.7  ALBUMIN 3.2*   No results for input(s): LIPASE, AMYLASE in the last 168 hours. No results for input(s): AMMONIA in the last 168 hours. Coagulation Profile: No results for input(s): INR, PROTIME in the last 168 hours. Cardiac Enzymes: No results for input(s): CKTOTAL, CKMB, CKMBINDEX, TROPONINI in the last 168 hours. BNP (last 3  results) No results for input(s): PROBNP in the last 8760 hours. HbA1C: No results for input(s): HGBA1C in the last 72 hours. CBG: No results for input(s): GLUCAP in the last 168 hours. Lipid Profile: No results for input(s): CHOL, HDL, LDLCALC, TRIG, CHOLHDL, LDLDIRECT in the last 72 hours. Thyroid Function Tests: No results for input(s): TSH, T4TOTAL, FREET4, T3FREE, THYROIDAB in the last 72 hours. Anemia Panel: No results for input(s): VITAMINB12, FOLATE, FERRITIN, TIBC, IRON, RETICCTPCT in the last 72 hours. Urine analysis:    Component Value Date/Time   COLORURINE YELLOW (A) 03/24/2017 2020   APPEARANCEUR HAZY (A) 03/24/2017 2020   APPEARANCEUR Hazy 05/17/2014 1545   LABSPEC 1.017 03/24/2017 2020   LABSPEC 1.024 05/17/2014 1545   PHURINE 5.0 03/24/2017 2020   GLUCOSEU >=500 (A) 03/24/2017 2020   GLUCOSEU >=500 05/17/2014 1545   HGBUR MODERATE (A) 03/24/2017 2020   BILIRUBINUR NEGATIVE 03/24/2017 2020   BILIRUBINUR Negative 05/17/2014 1545   KETONESUR NEGATIVE 03/24/2017 2020   PROTEINUR 100 (A) 03/24/2017 2020   NITRITE NEGATIVE 03/24/2017 2020   LEUKOCYTESUR NEGATIVE 03/24/2017 2020   LEUKOCYTESUR Trace 05/17/2014 1545    Radiological Exams on Admission: CT Head Wo Contrast  Result Date: 11/06/2019 CLINICAL DATA:  Encephalopathy EXAM: CT HEAD WITHOUT CONTRAST TECHNIQUE: Contiguous axial images were obtained from the base of the skull through the vertex without intravenous contrast. COMPARISON:  09/16/2019 FINDINGS: Brain: There is no mass, hemorrhage or extra-axial collection. The size and configuration of the ventricles and extra-axial CSF spaces are normal. The brain parenchyma is normal, without acute or chronic infarction. Vascular: No abnormal hyperdensity of the major intracranial arteries or dural venous sinuses. No intracranial atherosclerosis. Skull: The visualized skull base, calvarium and extracranial soft tissues are normal. Sinuses/Orbits: No fluid levels or  advanced mucosal thickening of the visualized paranasal sinuses. No mastoid or middle ear effusion. Chronic right globe hyperattenuation. IMPRESSION: Normal head CT. Electronically Signed   By: Ulyses Jarred M.D.   On: 11/06/2019 03:46   DG Chest Portable 1 View  Result Date: 11/06/2019 CLINICAL DATA:  Seizure. EXAM: PORTABLE CHEST 1 VIEW COMPARISON:  None FINDINGS: Cardiac enlargement. Decreased lung volumes with mild asymmetric elevation of right hemidiaphragm no pleural effusion, interstitial edema or airspace consolidation. Visualized osseous structures are unremarkable. IMPRESSION:  1. No acute cardiopulmonary abnormalities. 2. Cardiac enlargement. Electronically Signed   By: Kerby Moors M.D.   On: 11/06/2019 04:24    EKG: Independently reviewed. Interpretation : Sinus tachycardia  Assessment/Plan 49 year old female with history of diabetes, hypertension, sleep apnea and morbid obesity, presenting with new onset seizure.    New onset seizure ([place])   Recurrent seizures ([place]) -Patient with witnessed seizure at home and again on arrival -Loaded with Keppra in the ER -Ativan as needed seizure continue Keppra 500 mg IV twice daily -Neurology consult in the a.m. -EEG ordered -Follow-up MRI and MRV ordered from the ER    SIRS (systemic inflammatory response syndrome) ([place]) -Patient with low-grade temperature, leukocytosis, tachycardia and tachypnea which may be related to seizure -No evidence of acute infection.  Chest x-ray clear.  Follow urinalysis    Hyperglycemia due to type 2 diabetes mellitus ([facility]) -Blood sugar 590 with normal anion gap.  Last A1c on file from 2 years prior was 12.8 -IV insulin per Endo tool for hyperglycemia without DKA or HHS    HTN (hypertension) -Continue home amlodipine    Morbid obesity with BMI of 50.0-59.9, adult ([place]) -Complicates overall prognosis time.  DVT prophylaxis: Lovenox  Code Status: full code  Family Communication: Husband at  bedside Disposition Plan: Back to previous home environment Consults called: none  Status:At the time of admission, it appears that the appropriate admission status for this patient is INPATIENT. This is judged to be reasonable and necessary in order to provide the required intensity of service to ensure the patient's safety given the presenting symptoms, physical exam findings, and initial radiographic and laboratory data in the context of their  Comorbid conditions.   Patient requires inpatient status due to high intensity of service, high risk for further deterioration and high frequency of surveillance required.   I certify that at the point of admission it is my clinical judgment that the patient will require inpatient hospital care spanning beyond [place] MD [facility]     11/06/2019, 4:52 AM

## 2019-11-06 NOTE — ED Notes (Signed)
bg 158

## 2019-11-06 NOTE — ED Notes (Signed)
Per endo tool restart insulin drip and 8.5 units/hour

## 2019-11-06 NOTE — ED Notes (Signed)
Pt's O2 sat decreased to 78% on RA while sleeping. This RN replaced O2 at 2L. O2 sats increased to 98% when awake.

## 2019-11-06 NOTE — ED Notes (Signed)
BG 329

## 2019-11-06 NOTE — Progress Notes (Signed)
PHARMACIST - PHYSICIAN COMMUNICATION  CONCERNING:  Enoxaparin (Lovenox) for DVT Prophylaxis    RECOMMENDATION: Patient was prescribed enoxaprin 40mg  q24 hours for VTE prophylaxis.   Filed Weights   11/06/19 0300  Weight: (!) 142.9 kg (315 lb)    Body mass index is 59.52 kg/m.  Estimated Creatinine Clearance: 83.5 mL/min (A) (by C-G formula based on SCr of 1.08 mg/dL (H)).   Based on [place] patient is candidate for enoxaparin 40mg  every 12 hours due to BMI being >40.   DESCRIPTION: Pharmacy has adjusted enoxaparin dose per [facility] policy.  Patient is now receiving enoxaparin 40mg  every 12 hours    Berta Minor, PharmD Clinical Pharmacist  11/06/2019 4:30 PM

## 2019-11-06 NOTE — ED Notes (Signed)
Pt drowsy but easily awakened. Answering questions appropriately.

## 2019-11-06 NOTE — Progress Notes (Signed)
Patient ID: Kathryn Hogan, female   DOB: 09-27-1970, 49 y.o.   MRN: [id number] [facility] PROGRESS NOTE  Kathryn Hogan:[id number] DOB: [birth year] DOA: 11/06/2019 PCP: [facility]  HPI/Subjective: Patient is seen this morning.  She was answering questions this morning but still not back to her baseline mental status.  She was postictal when the admitting physician saw her.  Patient had a headache.  Had a seizure at home while in the shower that has been witnessed.  Patient had another seizure while at CT scan.  Patient was loaded with Keppra.  Patient was placed on insulin drip secondary to elevated sugars.  Objective: Vitals:   11/06/19 1700 11/06/19 1730  BP: 139/84 (!) 133/94  Pulse: 86 87  Resp: 17 19  Temp:    SpO2: 96% 94%    Intake/Output Summary (Last 24 hours) at 11/06/2019 1757 Last data filed at 11/06/2019 1605 Gross per 24 hour  Intake 2150 ml  Output 900 ml  Net 1250 ml   Filed Weights   11/06/19 0300  Weight: (!) 142.9 kg    ROS: Review of Systems  Respiratory: Negative for shortness of breath.   Cardiovascular: Negative for chest pain.  Gastrointestinal: Negative for abdominal pain.  Neurological: Positive for headaches.   Exam: Physical Exam HENT:     Head: Normocephalic.     Nose: No mucosal edema.     Mouth/Throat:     Pharynx: No oropharyngeal exudate.  Eyes:     General: Lids are normal.     Conjunctiva/sclera: Conjunctivae normal.     Pupils: Pupils are equal, round, and reactive to light.  Cardiovascular:     Rate and Rhythm: Normal rate and regular rhythm.     Heart sounds: Normal heart sounds, S1 normal and S2 normal.  Pulmonary:     Breath sounds: No decreased breath sounds, wheezing, rhonchi or rales.  Abdominal:     General: Abdomen is flat.     Palpations: Abdomen is soft.     Tenderness: There is no abdominal tenderness.  Musculoskeletal:     Right lower leg: No swelling.     Left lower leg: No  swelling.  Skin:    General: Skin is warm.     Findings: No rash.  Neurological:     Mental Status: She is lethargic.     Comments: Patient was able to move all her extremities.  Have a little bit of slurred speech.       Data Reviewed: Basic Metabolic Panel: Recent Labs  Lab 11/06/19 0311  NA 132*  K 3.8  CL 94*  CO2 25  GLUCOSE 590*  BUN 18  CREATININE 1.08*  CALCIUM 8.7*   Liver Function Tests: Recent Labs  Lab 11/06/19 0311  AST 22  ALT 17  ALKPHOS 95  BILITOT 0.7  PROT 6.7  ALBUMIN 3.2*   CBC: Recent Labs  Lab 11/06/19 0311  WBC 14.6*  HGB 13.8  HCT 39.7  MCV 78.5*  PLT 297    CBG: Recent Labs  Lab 11/06/19 1200 11/06/19 1315 11/06/19 1418 11/06/19 1523 11/06/19 1710  GLUCAP 150* 150* 222* 249* 118*    Recent Results (from the past 240 hour(s))  SARS Coronavirus 2 by RT PCR (hospital order, performed in [facility] hospital lab) Nasopharyngeal Nasopharyngeal Swab     Status: None   Collection Time: 11/06/19  4:42 AM   Specimen: Nasopharyngeal Swab  Result Value Ref Range Status   SARS Coronavirus  2 NEGATIVE NEGATIVE Final    Comment: (NOTE) SARS-CoV-2 target nucleic acids are NOT DETECTED.  The SARS-CoV-2 RNA is generally detectable in upper and lower respiratory specimens during the acute phase of infection. The lowest concentration of SARS-CoV-2 viral copies this assay can detect is 250 copies / mL. A negative result does not preclude SARS-CoV-2 infection and should not be used as the sole basis for treatment or other patient management decisions.  A negative result may occur with improper specimen collection / handling, submission of specimen other than nasopharyngeal swab, presence of viral mutation(s) within the areas targeted by this assay, and inadequate number of viral copies (<250 copies / mL). A negative result must be combined with clinical observations, patient history, and epidemiological information.  Fact Sheet for  Patients:   StrictlyIdeas.no  Fact Sheet for Healthcare Providers: BankingDealers.co.za  This test is not yet approved or  cleared by the [place] FDA and has been authorized for detection and/or diagnosis of SARS-CoV-2 by FDA under an Emergency Use Authorization (EUA).  This EUA will remain in effect (meaning this test can be used) for the duration of the COVID-19 declaration under Section 564(b)(1) of the Act, 21 U.S.C. section 360bbb-3(b)(1), unless the authorization is terminated or revoked sooner.  Performed at [facility], [address] [place], [place] [address]      Studies: CT Head Wo Contrast  Result Date: 11/06/2019 CLINICAL DATA:  Encephalopathy EXAM: CT HEAD WITHOUT CONTRAST TECHNIQUE: Contiguous axial images were obtained from the base of the skull through the vertex without intravenous contrast. COMPARISON:  09/16/2019 FINDINGS: Brain: There is no mass, hemorrhage or extra-axial collection. The size and configuration of the ventricles and extra-axial CSF spaces are normal. The brain parenchyma is normal, without acute or chronic infarction. Vascular: No abnormal hyperdensity of the major intracranial arteries or dural venous sinuses. No intracranial atherosclerosis. Skull: The visualized skull base, calvarium and extracranial soft tissues are normal. Sinuses/Orbits: No fluid levels or advanced mucosal thickening of the visualized paranasal sinuses. No mastoid or middle ear effusion. Chronic right globe hyperattenuation. IMPRESSION: Normal head CT. Electronically Signed   By: Ulyses Jarred M.D.   On: 11/06/2019 03:46   DG Chest Portable 1 View  Result Date: 11/06/2019 CLINICAL DATA:  Seizure. EXAM: PORTABLE CHEST 1 VIEW COMPARISON:  None FINDINGS: Cardiac enlargement. Decreased lung volumes with mild asymmetric elevation of right hemidiaphragm no pleural effusion, interstitial edema or airspace consolidation.  Visualized osseous structures are unremarkable. IMPRESSION: 1. No acute cardiopulmonary abnormalities. 2. Cardiac enlargement. Electronically Signed   By: Kerby Moors M.D.   On: 11/06/2019 04:24    Scheduled Meds: . enoxaparin (LOVENOX) injection  40 mg Subcutaneous Q12H  . insulin glargine  40 Units Subcutaneous Daily   Continuous Infusions: . dextrose 5 % and 0.45% NaCl 125 mL/hr at 11/06/19 1000  . insulin Stopped (11/06/19 1712)  . lactated ringers Stopped (11/06/19 1941)    Assessment/Plan:  1. Seizure with prolonged postictal and confusion..  I called neurology this morning when she was not back to her baseline mental status.  Neurology believes that the seizure was secondary to elevated sugars.  He recommended canceling the MRI of the brain because she recently had one. No driving.  He recommended intranasal midazolam if needed for seizure.  He was okay with stopping seizure medications.  Outpatient EEG. 2. Type 2 diabetes mellitus with hyperglycemia with sugars up at 590.  Patient placed on insulin drip.  Been trying to get off insulin  drip during the day but currently still on.  Nursing staff will let me know once off insulin drip and then will restart Lantus and short acting insulin.  We will watch overnight and hopefully be discharging tomorrow morning.  Hemoglobin A1c elevated at 11.5. 3. Essential hypertension.  Re start Lotensin. 4. Morbid obesity with a BMI of 59.52.  Weight loss needed 5. Headache.  Ordered a dose of Toradol and magnesium. 6. SIRS with elevated white blood cell count, tachycardia and tachypnea likely related to seizure.       Code Status:     Code Status Orders  (From admission, onward)         Start     Ordered   11/06/19 0446  Full code  Continuous        11/06/19 0451        Code Status History    This patient has a current code status but no historical code status.   Advance Care Planning Activity     Family Communication: Spoke  with husband at the bedside Disposition Plan: Status is: Inpatient  Dispo: The patient is from: Home              Anticipated d/c is to: Home              Anticipated d/c date is: Hopefully on 11/07/2019              Patient currently on insulin drip hopefully trying to get off insulin drip.  Had 2 seizures.  Time spent: 34 minutes  [place]

## 2019-11-06 NOTE — Progress Notes (Signed)
Pharmacy Note - DDI for AEDs  Pt on lorazepam prn and levetiracetam scheduled at this time  Combination can enhance potential to depress CNS function; medication changes not warranted at this time. Will continue to monitor.  Rayna Sexton, PharmD, BCPS Clinical Pharmacist 11/06/2019 3:31 PM

## 2019-11-07 DIAGNOSIS — N179 Acute kidney failure, unspecified: Secondary | ICD-10-CM | POA: Insufficient documentation

## 2019-11-07 LAB — BASIC METABOLIC PANEL
Anion gap: 7 (ref 5–15)
Anion gap: 7 (ref 5–15)
BUN: 28 mg/dL — ABNORMAL HIGH (ref 6–20)
BUN: 27 mg/dL — ABNORMAL HIGH (ref 6–20)
CO2: 29 mmol/L (ref 22–32)
CO2: 29 mmol/L (ref 22–32)
Calcium: 8.1 mg/dL — ABNORMAL LOW (ref 8.9–10.3)
Calcium: 8.1 mg/dL — ABNORMAL LOW (ref 8.9–10.3)
Chloride: 100 mmol/L (ref 98–111)
Chloride: 100 mmol/L (ref 98–111)
Creatinine, Ser: 1.71 mg/dL — ABNORMAL HIGH (ref 0.44–1.00)
Creatinine, Ser: 1.62 mg/dL — ABNORMAL HIGH (ref 0.44–1.00)
GFR calc Af Amer: 39 mL/min — ABNORMAL LOW (ref >60)
GFR calc Af Amer: 42 mL/min — ABNORMAL LOW (ref >60)
GFR calc non Af Amer: 34 mL/min — ABNORMAL LOW (ref >60)
GFR calc non Af Amer: 36 mL/min — ABNORMAL LOW (ref >60)
Glucose, Bld: 169 mg/dL — ABNORMAL HIGH (ref 70–99)
Glucose, Bld: 193 mg/dL — ABNORMAL HIGH (ref 70–99)
Potassium: 3.6 mmol/L (ref 3.5–5.1)
Potassium: 3.9 mmol/L (ref 3.5–5.1)
Sodium: 136 mmol/L (ref 135–145)
Sodium: 136 mmol/L (ref 135–145)

## 2019-11-07 LAB — CBC
HCT: 32.8 % — ABNORMAL LOW (ref 36.0–46.0)
Hemoglobin: 10.8 g/dL — ABNORMAL LOW (ref 12.0–15.0)
MCH: 27.1 pg (ref 26.0–34.0)
MCHC: 32.9 g/dL (ref 30.0–36.0)
MCV: 82.2 fL (ref 80.0–100.0)
Platelets: 256 10*3/uL (ref 150–400)
RBC: 3.99 MIL/uL (ref 3.87–5.11)
RDW: 12.8 % (ref 11.5–15.5)
WBC: 11.5 10*3/uL — ABNORMAL HIGH (ref 4.0–10.5)
nRBC: 0 % (ref 0.0–0.2)

## 2019-11-07 LAB — GLUCOSE, CAPILLARY
Glucose-Capillary: 166 mg/dL — ABNORMAL HIGH (ref 70–99)
Glucose-Capillary: 148 mg/dL — ABNORMAL HIGH (ref 70–99)
Glucose-Capillary: 176 mg/dL — ABNORMAL HIGH (ref 70–99)
Glucose-Capillary: 216 mg/dL — ABNORMAL HIGH (ref 70–99)

## 2019-11-07 LAB — MAGNESIUM: Magnesium: 2.4 mg/dL (ref 1.7–2.4)

## 2019-11-07 MED ORDER — INSULIN ASPART 100 UNIT/ML ~~LOC~~ SOLN: 0.0000 [IU] | Freq: Every day | SUBCUTANEOUS | Status: DC

## 2019-11-07 MED ORDER — INSULIN ISOPHANE HUMAN 100 UNIT/ML KWIKPEN: 6.0000 [IU] | PEN_INJECTOR | Freq: Three times a day (TID) | SUBCUTANEOUS | 0 refills | Status: DC

## 2019-11-07 MED ORDER — MIDAZOLAM 5 MG/0.1ML NA SOLN: 0.1000 mL | NASAL | 0 refills | Status: DC | PRN

## 2019-11-07 MED ORDER — INSULIN ASPART 100 UNIT/ML ~~LOC~~ SOLN: 0.0000 [IU] | Freq: Three times a day (TID) | SUBCUTANEOUS | Status: DC

## 2019-11-07 MED ORDER — LACTATED RINGERS IV SOLN: INTRAVENOUS | Status: DC

## 2019-11-07 MED ORDER — INSULIN ASPART 100 UNIT/ML ~~LOC~~ SOLN: 3.0000 [IU] | Freq: Three times a day (TID) | SUBCUTANEOUS | Status: DC

## 2019-11-07 MED ORDER — INSULIN GLARGINE 100 UNIT/ML ~~LOC~~ SOLN
40.0000 [IU] | Freq: Every day | SUBCUTANEOUS | Status: DC
  Administered 2019-11-07: 40 [IU] via SUBCUTANEOUS
  Filled 2019-11-07: qty 0.4

## 2019-11-07 MED ORDER — INSULIN PEN NEEDLE 31G X 5 MM MISC: 1.0000 | Freq: Three times a day (TID) | 0 refills | Status: AC

## 2019-11-07 MED ORDER — METOPROLOL SUCCINATE ER 50 MG PO TB24: 50.0000 mg | ORAL_TABLET | Freq: Every day | ORAL | Status: DC

## 2019-11-07 NOTE — Discharge Summary (Signed)
[facility] NAME: Kathryn Hogan    MR#:  [id number]  DATE OF BIRTH:  Oct 10, 1970  DATE OF ADMISSION:  11/06/2019 ADMITTING PHYSICIAN: Loletha Grayer, MD  DATE OF DISCHARGE: 11/07/2019 10:06 AM  PRIMARY CARE PHYSICIAN: [facility] [place] DIAGNOSIS:  New onset seizure ([place]) [R56.9] Hyperglycemia [R73.9]  DISCHARGE DIAGNOSIS:  Principal Problem:   New onset seizure ([place]) Active Problems:   Hyperglycemia due to type 2 diabetes mellitus ([place])   Recurrent seizures ([place])   HTN (hypertension)   Morbid obesity with BMI of 50.0-59.9, adult (HCC)   SIRS (systemic inflammatory response syndrome) ([place])   Hyperglycemia   SECONDARY DIAGNOSIS:   Past Medical History:  Diagnosis Date  . Anxiety   . Diabetes mellitus without complication ([place])   . Hypertension   . Sleep apnea    does not use CPAP    HOSPITAL COURSE:   1.  Seizure with prolonged postictal state and confusion.  Appreciate neurology consultation.  Neurology believes the seizure was secondary to elevated glucose.  He canceled the MRI of the brain.  No driving.  He recommended intranasal midazolam if needed for seizure.  Okay with stopping seizure medications.  Recommended outpatient EEG. 2.  Type 2 diabetes mellitus with hyperglycemia with sugars up at 590.  Patient was initially placed on insulin drip.  Patient was able to come off insulin drip last night and back on Lantus insulin.  Hemoglobin A1c elevated at 11.5.  Called pharmacy to add short acting insulin prior to meals.  Hold on Ozempic and Metformin at this point 3.  Acute kidney injury.  Patient was given IV fluid hydration.  Creatinine down to 1.62 on disposition.  Continue to hold torsemide and recheck creatinine as outpatient. 4.  Essential hypertension can go back on Lotensin and Toprol as outpatient 5.  Morbid obesity with a BMI of 59.52.  Weight loss needed.  Recommend screening for  sleep apnea with sleep study as outpatient.  DISCHARGE CONDITIONS:   Satisfactory  CONSULTS OBTAINED:  Neurology  DRUG ALLERGIES:  No Known Allergies  DISCHARGE MEDICATIONS:   Allergies as of 11/07/2019   No Known Allergies     Medication List    STOP taking these medications   metFORMIN 500 MG tablet Commonly known as: GLUCOPHAGE   Ozempic (0.25 or 0.5 MG/DOSE) 2 MG/1.5ML Sopn Generic drug: Semaglutide(0.25 or 0.5MG /DOS)   torsemide 20 MG tablet Commonly known as: DEMADEX     TAKE these medications   acetaminophen 500 MG tablet Commonly known as: TYLENOL Take 1,000 mg by mouth every 6 (six) hours as needed for moderate pain.   benazepril 40 MG tablet Commonly known as: LOTENSIN Take 1 tablet (40 mg total) by mouth daily.   insulin glargine 100 unit/mL Sopn Commonly known as: LANTUS Inject 40 Units into the skin daily.   Insulin Pen Needle 31G X 5 MM Misc 1 Dose by Does not apply route 3 (three) times daily before meals.   metoprolol succinate 100 MG 24 hr tablet Commonly known as: TOPROL-XL Take 100 mg by mouth daily. Take with or immediately following a meal.   Midazolam 5 MG/0.1ML Soln Place 0.1 mLs into the nose as needed (for seizure lasting more than 5 mins.). For seizure activity lasting more than 5 mins, use metered spray of 0.1 mL containing 0.5 mg (a solution of 5 mg/mL), three to five times, per nostril, and repeat if necessary, to a  total dose of 10 mg for adults and call 911 afterwards immediately.      Called pharmacy Switched Novolin R pen 6 units prior to meals (have in stock) The do not have midazolam and its $600, so we switched to diastat rectal gel 10mg  2 pens.  DISCHARGE INSTRUCTIONS:   Satisfactory  If you experience worsening of your admission symptoms, develop shortness of breath, life threatening emergency, suicidal or homicidal thoughts you must seek medical attention immediately by calling 911 or calling your MD immediately  if  symptoms less severe.  You Must read complete instructions/literature along with all the possible adverse reactions/side effects for all the Medicines you take and that have been prescribed to you. Take any new Medicines after you have completely understood and accept all the possible adverse reactions/side effects.   Please note  You were cared for by a hospitalist during your hospital stay. If you have any questions about your discharge medications or the care you received while you were in the hospital after you are discharged, you can call the unit and asked to speak with the hospitalist on call if the hospitalist that took care of you is not available. Once you are discharged, your primary care physician will handle any further medical issues. Please note that NO REFILLS for any discharge medications will be authorized once you are discharged, as it is imperative that you return to your primary care physician (or establish a relationship with a primary care physician if you do not have one) for your aftercare needs so that they can reassess your need for medications and monitor your lab values.    Today   CHIEF COMPLAINT:   Chief Complaint  Patient presents with  . Possible seizure    HISTORY OF PRESENT ILLNESS:  Kathryn Hogan  is a 49 y.o. female brought in with seizure and found to have elevated sugars   VITAL SIGNS:  Blood pressure 139/87, pulse 87, temperature 98.7 F (37.1 C), temperature source Oral, resp. rate 16, height 5\' 1"  (1.549 m), weight (!) 142.9 kg, SpO2 97 %.  I/O:    Intake/Output Summary (Last 24 hours) at 11/07/2019 1629 Last data filed at 11/07/2019 1003 Gross per 24 hour  Intake 516.25 ml  Output --  Net 516.25 ml    PHYSICAL EXAMINATION:  GENERAL:  49 y.o.-year-old patient lying in the bed with no acute distress.  EYES: Pupils equal, round, reactive to light and accommodation. No scleral icterus. Extraocular muscles intact.  HEENT: Head atraumatic,  normocephalic. Oropharynx and nasopharynx clear. .  LUNGS: Normal breath sounds bilaterally, no wheezing, rales,rhonchi or crepitation. No use of accessory muscles of respiration.  CARDIOVASCULAR: S1, S2 normal. No murmurs, rubs, or gallops.  ABDOMEN: Soft, non-tender, non-distended.  EXTREMITIES: No pedal edema.  NEUROLOGIC: Cranial nerves II through XII are intact. Muscle strength 5/5 in all extremities. Sensation intact. Gait not checked.  PSYCHIATRIC: The patient is alert and oriented x 3.  SKIN: No obvious rash, lesion, or ulcer.   DATA REVIEW:   CBC Recent Labs  Lab 11/07/19 0148  WBC 11.5*  HGB 10.8*  HCT 32.8*  PLT 256    Chemistries  Recent Labs  Lab 11/06/19 0311 11/06/19 0311 11/07/19 0148 11/07/19 0148 11/07/19 0608  NA 132*   < > 136   < > 136  K 3.8   < > 3.6   < > 3.9  CL 94*   < > 100   < > 100  CO2 25   < >  29   < > 29  GLUCOSE 590*   < > 169*   < > 193*  BUN 18   < > 28*   < > 27*  CREATININE 1.08*   < > 1.71*   < > 1.62*  CALCIUM 8.7*   < > 8.1*   < > 8.1*  MG  --   --  2.4  --   --   AST 22  --   --   --   --   ALT 17  --   --   --   --   ALKPHOS 95  --   --   --   --   BILITOT 0.7  --   --   --   --    < > = values in this interval not displayed.     Microbiology Results  Results for orders placed or performed during the hospital encounter of 11/06/19  SARS Coronavirus 2 by RT PCR (hospital order, performed in [facility] hospital lab) Nasopharyngeal Nasopharyngeal Swab     Status: None   Collection Time: 11/06/19  4:42 AM   Specimen: Nasopharyngeal Swab  Result Value Ref Range Status   SARS Coronavirus 2 NEGATIVE NEGATIVE Final    Comment: (NOTE) SARS-CoV-2 target nucleic acids are NOT DETECTED.  The SARS-CoV-2 RNA is generally detectable in upper and lower respiratory specimens during the acute phase of infection. The lowest concentration of SARS-CoV-2 viral copies this assay can detect is 250 copies / mL. A negative result does not  preclude SARS-CoV-2 infection and should not be used as the sole basis for treatment or other patient management decisions.  A negative result may occur with improper specimen collection / handling, submission of specimen other than nasopharyngeal swab, presence of viral mutation(s) within the areas targeted by this assay, and inadequate number of viral copies (<250 copies / mL). A negative result must be combined with clinical observations, patient history, and epidemiological information.  Fact Sheet for Patients:   StrictlyIdeas.no  Fact Sheet for Healthcare Providers: BankingDealers.co.za  This test is not yet approved or  cleared by the [place] FDA and has been authorized for detection and/or diagnosis of SARS-CoV-2 by FDA under an Emergency Use Authorization (EUA).  This EUA will remain in effect (meaning this test can be used) for the duration of the COVID-19 declaration under Section 564(b)(1) of the Act, 21 U.S.C. section 360bbb-3(b)(1), unless the authorization is terminated or revoked sooner.  Performed at [facility], [address] [place], [place] [address]     RADIOLOGY:  CT Head Wo Contrast  Result Date: 11/06/2019 CLINICAL DATA:  Encephalopathy EXAM: CT HEAD WITHOUT CONTRAST TECHNIQUE: Contiguous axial images were obtained from the base of the skull through the vertex without intravenous contrast. COMPARISON:  09/16/2019 FINDINGS: Brain: There is no mass, hemorrhage or extra-axial collection. The size and configuration of the ventricles and extra-axial CSF spaces are normal. The brain parenchyma is normal, without acute or chronic infarction. Vascular: No abnormal hyperdensity of the major intracranial arteries or dural venous sinuses. No intracranial atherosclerosis. Skull: The visualized skull base, calvarium and extracranial soft tissues are normal. Sinuses/Orbits: No fluid levels or advanced mucosal  thickening of the visualized paranasal sinuses. No mastoid or middle ear effusion. Chronic right globe hyperattenuation. IMPRESSION: Normal head CT. Electronically Signed   By: Ulyses Jarred M.D.   On: 11/06/2019 03:46   DG Chest Portable 1 View  Result Date: 11/06/2019 CLINICAL DATA:  Seizure. EXAM: PORTABLE CHEST 1 VIEW COMPARISON:  None FINDINGS: Cardiac enlargement. Decreased lung volumes with mild asymmetric elevation of right hemidiaphragm no pleural effusion, interstitial edema or airspace consolidation. Visualized osseous structures are unremarkable. IMPRESSION: 1. No acute cardiopulmonary abnormalities. 2. Cardiac enlargement. Electronically Signed   By: Kerby Moors M.D.   On: 11/06/2019 04:24     Management plans discussed with the patient, family and they are in agreement.  CODE STATUS:  Code Status History    Date Active Date Inactive Code Status Order ID Comments User Context   11/06/2019 0451 11/07/2019 1512 Full Code [id number]  Athena Masse, MD ED   Advance Care Planning Activity    Questions for Most Recent Historical Code Status (Order [id number])       TOTAL TIME TAKING CARE OF THIS PATIENT: 38 minutes.    Loletha Grayer M.D on 11/07/2019 at 4:29 PM  Between 7am to 6pm - Pager - [phone]  After 6pm go to [email or web address] - Proofreader  [facility]  CC: Primary care physician; [facility]

## 2019-11-07 NOTE — Discharge Instructions (Signed)
Recommend outpatient eeg and sleep study No driving Recommend neurology and endocrinology referrals as outpatient  Seizure, Adult A seizure is a sudden burst of abnormal electrical activity in the brain. Seizures usually last from 30 seconds to 2 minutes. They can cause many different symptoms. Usually, seizures are not harmful unless they last a long time. What are the causes? Common causes of this condition include:  Fever or infection.  Conditions that affect the brain, such as: ? A brain abnormality that you were born with. ? A brain or head injury. ? Bleeding in the brain. ? A tumor. ? Stroke. ? Brain disorders such as autism or cerebral palsy.  Low blood sugar.  Conditions that are passed from parent to child (are inherited).  Problems with substances, such as: ? Having a reaction to a drug or a medicine. ? Suddenly stopping the use of a substance (withdrawal). In some cases, the cause may not be known. A person who has repeated seizures over time without a clear cause has a condition called epilepsy. What increases the risk? You are more likely to get this condition if you have:  A family history of epilepsy.  Had a seizure in the past.  A brain disorder.  A history of head injury, lack of oxygen at birth, or strokes. What are the signs or symptoms? There are many types of seizures. The symptoms vary depending on the type of seizure you have. Examples of symptoms during a seizure include:  Shaking (convulsions).  Stiffness in the body.  Passing out (losing consciousness).  Head nodding.  Staring.  Not responding to sound or touch.  Loss of bladder control and bowel control. Some people have symptoms right before and right after a seizure happens. Symptoms before a seizure may include:  Fear.  Worry (anxiety).  Feeling like you may vomit (nauseous).  Feeling like the room is spinning (vertigo).  Feeling like you saw or heard something before  (dj vu).  Odd tastes or smells.  Changes in how you see. You may see flashing lights or spots. Symptoms after a seizure happens can include:  Confusion.  Sleepiness.  Headache.  Weakness on one side of the body. How is this treated? Most seizures will stop on their own in under 5 minutes. In these cases, no treatment is needed. Seizures that last longer than 5 minutes will usually need treatment. Treatment can include:  Medicines given through an IV tube.  Avoiding things that are known to cause your seizures. These can include medicines that you take for another condition.  Medicines to treat epilepsy.  Surgery to stop the seizures. This may be needed if medicines do not help. Follow these instructions at home: Medicines  Take over-the-counter and prescription medicines only as told by your doctor.  Do not eat or drink anything that may keep your medicine from working, such as alcohol. Activity  Do not do any activities that would be dangerous if you had another seizure, like driving or swimming. Wait until your doctor says it is safe for you to do them.  If you live in the [place] ask your local DMV (department of motor vehicles) when you can drive.  Get plenty of rest. Teaching others Teach friends and family what to do when you have a seizure. They should:  Lay you on the ground.  Protect your head and body.  Loosen any tight clothing around your neck.  Turn you on your side.  Not hold you down.  Not  put anything into your mouth.  Know whether or not you need emergency care.  Stay with you until you are better.  General instructions  Contact your doctor each time you have a seizure.  Avoid anything that gives you seizures.  Keep a seizure diary. Write down: ? What you think caused each seizure. ? What you remember about each seizure.  Keep all follow-up visits as told by your doctor. This is important. Contact a doctor if:  You have another  seizure.  You have seizures more often.  There is any change in what happens during your seizures.  You keep having seizures with treatment.  You have symptoms of being sick or having an infection. Get help right away if:  You have a seizure that: ? Lasts longer than 5 minutes. ? Is different than seizures you had before. ? Makes it harder to breathe. ? Happens after you hurt your head.  You have any of these symptoms after a seizure: ? Not being able to speak. ? Not being able to use a part of your body. ? Confusion. ? A bad headache.  You have two or more seizures in a row.  You do not wake up right after a seizure.  You get hurt during a seizure. These symptoms may be an emergency. Do not wait to see if the symptoms will go away. Get medical help right away. Call your local emergency services (911 in the [place] Do not drive yourself to the hospital. Summary  Seizures usually last from 30 seconds to 2 minutes. Usually, they are not harmful unless they last a long time.  Do not eat or drink anything that may keep your medicine from working, such as alcohol.  Teach friends and family what to do when you have a seizure.  Contact your doctor each time you have a seizure. This information is not intended to replace advice given to you by your health care provider. Make sure you discuss any questions you have with your health care provider. Document Revised: 04/24/2018 Document Reviewed: 04/24/2018 Elsevier Patient Education  [place].

## 2019-11-07 NOTE — ED Notes (Signed)
Admitting MD messaged about insulin drip and endo tool. New orders placed.

## 2019-11-07 NOTE — ED Notes (Signed)
Pharmacy asked about missing dose of lantus. Dose rescheduled by pharmacy, admitting aware.

## 2021-10-18 ENCOUNTER — Emergency Department: Payer: Self-pay

## 2021-10-18 ENCOUNTER — Other Ambulatory Visit: Payer: Self-pay

## 2021-10-18 ENCOUNTER — Emergency Department
Admission: EM | Admit: 2021-10-18 | Discharge: 2021-10-18 | Disposition: A | Discharge status: Home / Self Care | Payer: Self-pay | Attending: Emergency Medicine | Admitting: Emergency Medicine

## 2021-10-18 DIAGNOSIS — M5412 Radiculopathy, cervical region: Secondary | ICD-10-CM | POA: Insufficient documentation

## 2021-10-18 DIAGNOSIS — T7840XA Allergy, unspecified, initial encounter: Secondary | ICD-10-CM | POA: Insufficient documentation

## 2021-10-18 DIAGNOSIS — E119 Type 2 diabetes mellitus without complications: Secondary | ICD-10-CM | POA: Insufficient documentation

## 2021-10-18 DIAGNOSIS — I10 Essential (primary) hypertension: Secondary | ICD-10-CM | POA: Insufficient documentation

## 2021-10-18 LAB — CBG MONITORING, ED: Glucose-Capillary: 173 mg/dL — ABNORMAL HIGH (ref 70–99)

## 2021-10-18 MED ORDER — METHYLPREDNISOLONE 4 MG PO TBPK
ORAL_TABLET | ORAL | 0 refills | Status: DC
Start: 2021-10-18 — End: 2023-01-23

## 2021-10-18 NOTE — ED Notes (Signed)
Fsbs 173

## 2021-10-18 NOTE — Discharge Instructions (Signed)
Please have nonemergent thyroid ultrasound as a small nodule was identified incidentally today. Please take Medrol Dosepak

## 2021-10-18 NOTE — ED Triage Notes (Signed)
Pt comes with c/o 2 days of itching to lfet arm. Pt denies any insect bite.

## 2021-10-18 NOTE — ED Provider Notes (Signed)
[facility] Provider Note  Patient Contact: 5:25 PM (approximate)   History   Allergic Reaction   HPI  Kathryn Hogan is a 51 y.o. female with a history of diabetes, hypertension and sleep apnea, presents to the emergency department with a stinging sensation that radiates along the left upper extremity and radiates into her hand.  Patient states that sometimes it feels as though she needs to itch the affected area.  She denies numbness or diminished grip strength.  She denies chest pain, chest tightness or shortness of breath.      Physical Exam   Triage Vital Signs: ED Triage Vitals  Enc Vitals Group     BP 10/18/21 1616 (!) 175/90     Pulse Rate 10/18/21 1616 83     Resp 10/18/21 1616 16     Temp 10/18/21 1616 98.6 F (37 C)     Temp Source 10/18/21 1616 Oral     SpO2 10/18/21 1616 94 %     Weight --      Height --      Head Circumference --      Peak Flow --      Pain Score 10/18/21 1614 3     Pain Loc --      Pain Edu? --      Excl. in [place]? --     Most recent vital signs: Vitals:   10/18/21 1616  BP: (!) 175/90  Pulse: 83  Resp: 16  Temp: 98.6 F (37 C)  SpO2: 94%     General: Alert and in no acute distress. Eyes:  PERRL. EOMI. Head: No acute traumatic findings ENT:      Nose: No congestion/rhinnorhea.      Mouth/Throat: Mucous membranes are moist. Neck: No stridor. No cervical spine tenderness to palpation. Cardiovascular:  Good peripheral perfusion Respiratory: Normal respiratory effort without tachypnea or retractions. Lungs CTAB. Good air entry to the bases with no decreased or absent breath sounds. Gastrointestinal: Bowel sounds 4 quadrants. Soft and nontender to palpation. No guarding or rigidity. No palpable masses. No distention. No CVA tenderness. Musculoskeletal: Full range of motion to all extremities.  Neurologic:  No gross focal neurologic deficits are appreciated.  Patient has no specific dermatomal  distribution to her paresthesias. Skin:   No rash noted Other:   ED Results / Procedures / Treatments   Labs (all labs ordered are listed, but only abnormal results are displayed) Labs Reviewed  CBG MONITORING, ED - Abnormal; Notable for the following components:      Result Value   Glucose-Capillary 173 (*)    All other components within normal limits        RADIOLOGY  I personally viewed and evaluated these images as part of my medical decision making, as well as reviewing the written report by the radiologist.  ED Provider Interpretation: Patient has bilateral foraminal narrowing at C7-T1 and degenerative changes noted at C3-C7.   PROCEDURES:  Critical Care performed: No  Procedures   MEDICATIONS ORDERED IN ED: Medications - No data to display   IMPRESSION / MDM / [place] / ED COURSE  I reviewed the triage vital signs and the nursing notes.                              Assessment and plan Paresthesias 51 year old female presents to the emergency department with a burning sensation along the left upper extremity  that sometimes feels pruritic in the absence of rash.  Patient was hypertensive at triage but vital signs were otherwise reassuring.  Patient had symmetric grip strength with no other neurodeficits.  Will obtain CT cervical spine and will reassess.    Patient has bilateral foraminal narrowing at C7-T1 and degenerative changes noted at C3-C7.  Patient's blood glucose was 173.  We will start patient on Medrol Dosepak.  Recommended return to the emergency department if symptoms seem to be worsening at home.  All patient questions were answered.   FINAL CLINICAL IMPRESSION(S) / ED DIAGNOSES   Final diagnoses:  Cervical radiculopathy     Rx / DC Orders   ED Discharge Orders          Ordered    methylPREDNISolone (MEDROL DOSEPAK) 4 MG TBPK tablet        10/18/21 1919             Note:  This document was prepared using Dragon  voice recognition software and may include unintentional dictation errors.   Vallarie Mare [place], PA-C 10/18/21 1927    Rada Hay, MD 10/21/21 [id number]

## 2021-12-28 ENCOUNTER — Other Ambulatory Visit: Payer: Self-pay | Admitting: Family Medicine

## 2021-12-28 DIAGNOSIS — Z1231 Encounter for screening mammogram for malignant neoplasm of breast: Secondary | ICD-10-CM

## 2021-12-28 DIAGNOSIS — E041 Nontoxic single thyroid nodule: Secondary | ICD-10-CM

## 2021-12-31 ENCOUNTER — Telehealth: Payer: Self-pay

## 2021-12-31 ENCOUNTER — Other Ambulatory Visit: Payer: Self-pay

## 2021-12-31 DIAGNOSIS — Z800 Family history of malignant neoplasm of digestive organs: Secondary | ICD-10-CM

## 2021-12-31 DIAGNOSIS — Z1211 Encounter for screening for malignant neoplasm of colon: Secondary | ICD-10-CM

## 2021-12-31 MED ORDER — NA SULFATE-K SULFATE-MG SULF 17.5-3.13-1.6 GM/177ML PO SOLN: 1.0000 | Freq: Once | ORAL | 0 refills | Status: AC

## 2021-12-31 NOTE — Telephone Encounter (Signed)
Gastroenterology Pre-Procedure Review  Request Date: 11/28 Requesting Physician: Dr. Vicente Males  PATIENT REVIEW QUESTIONS: The patient responded to the following health history questions as indicated:    1. Are you having any GI issues? no 2. Do you have a personal history of Polyps? no 3. Do you have a family history of Colon Cancer or Polyps? yes (father colon cancer) 4. Diabetes Mellitus? no 5. Joint replacements in the past 12 months?no 6. Major health problems in the past 3 months?no 7. Any artificial heart valves, MVP, or defibrillator?no    MEDICATIONS & ALLERGIES:    Patient reports the following regarding taking any anticoagulation/antiplatelet therapy:   Plavix, Coumadin, Eliquis, Xarelto, Lovenox, Pradaxa, Brilinta, or Effient? no Aspirin? no  Patient confirms/reports the following medications:  Current Outpatient Medications  Medication Sig Dispense Refill   acetaminophen (TYLENOL) 500 MG tablet Take 1,000 mg by mouth every 6 (six) hours as needed for moderate pain.     benazepril (LOTENSIN) 40 MG tablet Take 1 tablet (40 mg total) by mouth daily. 30 tablet 1   insulin glargine (LANTUS) 100 unit/mL SOPN Inject 40 Units into the skin daily.     Insulin Pen Needle 31G X 5 MM MISC 1 Dose by Does not apply route 3 (three) times daily before meals. 100 each 0   methylPREDNISolone (MEDROL DOSEPAK) 4 MG TBPK tablet 6,5,4,3,2,1 21 tablet 0   metoprolol succinate (TOPROL-XL) 100 MG 24 hr tablet Take 100 mg by mouth daily. Take with or immediately following a meal.     Midazolam 5 MG/0.1ML SOLN Place 0.1 mLs into the nose as needed (for seizure lasting more than 5 mins.). For seizure activity lasting more than 5 mins, use metered spray of 0.1 mL containing 0.5 mg (a solution of 5 mg/mL), three to five times, per nostril, and repeat if necessary, to a total dose of 10 mg for adults and call 911 afterwards immediately. 1 each 0   No current facility-administered medications for this visit.     Patient confirms/reports the following allergies:  No Known Allergies  No orders of the defined types were placed in this encounter.   AUTHORIZATION INFORMATION Primary Insurance: 1D#: Group #:  Secondary Insurance: 1D#: Group #:  SCHEDULE INFORMATION: Date:  Time: Location:

## 2022-01-03 ENCOUNTER — Ambulatory Visit: Admission: RE | Admit: 2022-01-03 | Payer: Commercial Managed Care - HMO | Source: Ambulatory Visit

## 2022-01-07 ENCOUNTER — Telehealth: Payer: Self-pay

## 2022-01-07 NOTE — Telephone Encounter (Signed)
Patient contacted office to reschedule her colonoscopy due to insurance changes.  Her colonoscopy has been rescheduled to 01/24/22.  Vikki in Endo has been notified of date change.  Thanks,  [place], [place]

## 2022-01-16 ENCOUNTER — Ambulatory Visit
Admission: RE | Admit: 2022-01-16 | Discharge: 2022-01-16 | Disposition: A | Discharge status: Home / Self Care | Payer: Commercial Managed Care - HMO | Source: Ambulatory Visit | Attending: Family Medicine | Admitting: Family Medicine

## 2022-01-16 DIAGNOSIS — E041 Nontoxic single thyroid nodule: Secondary | ICD-10-CM | POA: Insufficient documentation

## 2022-01-24 ENCOUNTER — Ambulatory Visit: Payer: Medicaid Other | Admitting: Registered Nurse

## 2022-01-24 ENCOUNTER — Encounter
Admission: RE | Disposition: A | Discharge status: Home / Self Care | Payer: Self-pay | Source: Home / Self Care | Attending: Gastroenterology

## 2022-01-24 ENCOUNTER — Ambulatory Visit
Admission: RE | Admit: 2022-01-24 | Discharge: 2022-01-24 | Disposition: A | Discharge status: Home / Self Care | Payer: Medicaid Other | Attending: Gastroenterology | Admitting: Gastroenterology

## 2022-01-24 ENCOUNTER — Encounter: Payer: Self-pay | Admitting: Gastroenterology

## 2022-01-24 DIAGNOSIS — D124 Benign neoplasm of descending colon: Secondary | ICD-10-CM | POA: Diagnosis not present

## 2022-01-24 DIAGNOSIS — Z1211 Encounter for screening for malignant neoplasm of colon: Secondary | ICD-10-CM | POA: Diagnosis present

## 2022-01-24 DIAGNOSIS — D123 Benign neoplasm of transverse colon: Secondary | ICD-10-CM | POA: Insufficient documentation

## 2022-01-24 DIAGNOSIS — G4730 Sleep apnea, unspecified: Secondary | ICD-10-CM | POA: Diagnosis not present

## 2022-01-24 DIAGNOSIS — D122 Benign neoplasm of ascending colon: Secondary | ICD-10-CM | POA: Insufficient documentation

## 2022-01-24 DIAGNOSIS — I10 Essential (primary) hypertension: Secondary | ICD-10-CM | POA: Diagnosis not present

## 2022-01-24 DIAGNOSIS — Z800 Family history of malignant neoplasm of digestive organs: Secondary | ICD-10-CM | POA: Diagnosis not present

## 2022-01-24 DIAGNOSIS — D126 Benign neoplasm of colon, unspecified: Secondary | ICD-10-CM | POA: Diagnosis not present

## 2022-01-24 DIAGNOSIS — F419 Anxiety disorder, unspecified: Secondary | ICD-10-CM | POA: Insufficient documentation

## 2022-01-24 DIAGNOSIS — E119 Type 2 diabetes mellitus without complications: Secondary | ICD-10-CM | POA: Insufficient documentation

## 2022-01-24 HISTORY — PX: COLONOSCOPY WITH PROPOFOL: SHX5780

## 2022-01-24 LAB — GLUCOSE, CAPILLARY: Glucose-Capillary: 168 mg/dL — ABNORMAL HIGH (ref 70–99)

## 2022-01-24 SURGERY — COLONOSCOPY WITH PROPOFOL
Anesthesia: General

## 2022-01-24 MED ORDER — PHENYLEPHRINE 80 MCG/ML (10ML) SYRINGE FOR IV PUSH (FOR BLOOD PRESSURE SUPPORT)
PREFILLED_SYRINGE | INTRAVENOUS | Status: AC
  Filled 2022-01-24: qty 10

## 2022-01-24 MED ORDER — PROPOFOL 1000 MG/100ML IV EMUL
INTRAVENOUS | Status: AC
  Filled 2022-01-24: qty 100

## 2022-01-24 MED ORDER — EPHEDRINE 5 MG/ML INJ
INTRAVENOUS | Status: AC
  Filled 2022-01-24: qty 5

## 2022-01-24 MED ORDER — LIDOCAINE HCL (CARDIAC) PF 100 MG/5ML IV SOSY
PREFILLED_SYRINGE | INTRAVENOUS | Status: DC | PRN
  Administered 2022-01-24: 100 mg via INTRAVENOUS

## 2022-01-24 MED ORDER — GLYCOPYRROLATE 0.2 MG/ML IJ SOLN
INTRAMUSCULAR | Status: DC | PRN
  Administered 2022-01-24: .2 mg via INTRAVENOUS

## 2022-01-24 MED ORDER — PROPOFOL 500 MG/50ML IV EMUL
INTRAVENOUS | Status: DC | PRN
  Administered 2022-01-24: 165.563 ug/kg/min via INTRAVENOUS

## 2022-01-24 MED ORDER — DEXMEDETOMIDINE HCL IN NACL 200 MCG/50ML IV SOLN
INTRAVENOUS | Status: DC | PRN
  Administered 2022-01-24: 20 ug via INTRAVENOUS

## 2022-01-24 MED ORDER — METOPROLOL TARTRATE 5 MG/5ML IV SOLN
INTRAVENOUS | Status: AC
  Filled 2022-01-24: qty 5

## 2022-01-24 MED ORDER — LIDOCAINE HCL (PF) 2 % IJ SOLN
INTRAMUSCULAR | Status: AC
  Filled 2022-01-24: qty 15

## 2022-01-24 MED ORDER — SODIUM CHLORIDE 0.9 % IV SOLN
INTRAVENOUS | Status: DC
  Administered 2022-01-24: 1000 mL via INTRAVENOUS

## 2022-01-24 MED ORDER — GLYCOPYRROLATE 0.2 MG/ML IJ SOLN
INTRAMUSCULAR | Status: AC
  Filled 2022-01-24: qty 1

## 2022-01-24 MED ORDER — PHENYLEPHRINE HCL (PRESSORS) 10 MG/ML IV SOLN
INTRAVENOUS | Status: DC | PRN
  Administered 2022-01-24 (×3): 240 ug via INTRAVENOUS

## 2022-01-24 MED ORDER — EPHEDRINE SULFATE (PRESSORS) 50 MG/ML IJ SOLN
INTRAMUSCULAR | Status: DC | PRN
  Administered 2022-01-24 (×2): 5 mg via INTRAVENOUS
  Administered 2022-01-24: 10 mg via INTRAVENOUS

## 2022-01-24 MED ORDER — LIDOCAINE HCL (PF) 2 % IJ SOLN
INTRAMUSCULAR | Status: AC
  Filled 2022-01-24: qty 20

## 2022-01-24 MED ORDER — PROPOFOL 10 MG/ML IV BOLUS
INTRAVENOUS | Status: DC | PRN
  Administered 2022-01-24: 130 mg via INTRAVENOUS

## 2022-01-24 MED ORDER — DEXMEDETOMIDINE HCL IN NACL 80 MCG/20ML IV SOLN
INTRAVENOUS | Status: AC
  Filled 2022-01-24: qty 20

## 2022-01-24 NOTE — Anesthesia Preprocedure Evaluation (Signed)
Anesthesia Evaluation  Patient identified by MRN, date of birth, ID band Patient awake    Reviewed: Allergy & Precautions, NPO status , Patient's Chart, lab work & pertinent test results  History of Anesthesia Complications Negative for: history of anesthetic complications  Airway Mallampati: III  TM Distance: <3 FB Neck ROM: full    Dental  (+) Chipped   Pulmonary neg shortness of breath, sleep apnea    Pulmonary exam normal        Cardiovascular Exercise Tolerance: Good hypertension, (-) angina Normal cardiovascular exam     Neuro/Psych  Headaches, Seizures -,   Anxiety      negative psych ROS   GI/Hepatic negative GI ROS, Neg liver ROS,,,  Endo/Other  negative endocrine ROSdiabetes    Renal/GU Renal disease  negative genitourinary   Musculoskeletal   Abdominal   Peds  Hematology negative hematology ROS (+)   Anesthesia Other Findings Past Medical History: No date: Anxiety No date: Diabetes mellitus without complication (HCC) No date: Hypertension No date: Sleep apnea     Comment:  does not use CPAP  Past Surgical History: No date: EYE SURGERY 07/25/2017: GAS/FLUID EXCHANGE; Left     Comment:  Procedure: GAS/FLUID EXCHANGE;  Surgeon: Jalene Mullet, MD;  Location: [place];  Service: Ophthalmology;               Laterality: Left;  SF6 No date: HERNIA REPAIR 07/25/2017: PARS PLANA VITRECTOMY; Left     Comment:  Procedure: PARS PLANA VITRECTOMY WITH 25 GAUGE LEFT EYE;              Surgeon: Jalene Mullet, MD;  Location: [place];  Service:              Ophthalmology;  Laterality: Left; 08/15/2017: PARS PLANA VITRECTOMY; Right     Comment:  Procedure: PARS PLANA VITRECTOMY WITH 25 GAUGE MEMBRANE               PILL WITH AIR GAS SILICONE OIL AND PHOTOCOAGULATION                RIGHT;  Surgeon: Jalene Mullet, MD;  Location: [place];                Service: Ophthalmology;  Laterality:  Right; 07/25/2017: PHOTOCOAGULATION WITH LASER; Left     Comment:  Procedure: [id number] WITH LASER;  Surgeon: Jalene Mullet, MD;  Location: [place];  Service: Ophthalmology;               Laterality: Left; No date: TUBAL LIGATION 42/70/6237: UMBILICAL HERNIA REPAIR; N/A     Comment:  Procedure: HERNIA REPAIR UMBILICAL ADULT;  Surgeon:               Christene Lye, MD;  Location: [facility];  Service:              General;  Laterality: N/A;  BMI    Body Mass Index: 56.61 kg/m      Reproductive/Obstetrics negative OB ROS                             Anesthesia Physical Anesthesia Plan  ASA: 3  Anesthesia Plan: General   Post-op Pain Management:    Induction: Intravenous  PONV Risk Score and Plan: Propofol infusion and  TIVA  Airway Management Planned: Natural Airway and Nasal Cannula  Additional Equipment:   Intra-op Plan:   Post-operative Plan:   Informed Consent: I have reviewed the patients History and Physical, chart, labs and discussed the procedure including the risks, benefits and alternatives for the proposed anesthesia with the patient or authorized representative who has indicated his/her understanding and acceptance.     Dental Advisory Given  Plan Discussed with: Anesthesiologist, CRNA and Surgeon  Anesthesia Plan Comments: (Patient consented for risks of anesthesia including but not limited to:  - adverse reactions to medications - risk of airway placement if required - damage to eyes, teeth, lips or other oral mucosa - nerve damage due to positioning  - sore throat or hoarseness - Damage to heart, brain, nerves, lungs, other parts of body or loss of life  Patient voiced understanding.)       Anesthesia Quick Evaluation

## 2022-01-24 NOTE — Transfer of Care (Signed)
Immediate Anesthesia Transfer of Care Note  Patient: Kathryn Hogan  Procedure(s) Performed: COLONOSCOPY WITH PROPOFOL  Patient Location: Endoscopy Unit  Anesthesia Type:General  Level of Consciousness: drowsy  Airway & Oxygen Therapy: Patient Spontanous Breathing and Patient connected to face mask oxygen  Post-op Assessment: Report given to RN and Post -op Vital signs reviewed and stable  Post vital signs: Reviewed and stable  Last Vitals:  Vitals Value Taken Time  BP    Temp    Pulse    Resp    SpO2      Last Pain:  Vitals:   01/24/22 0712  TempSrc: Temporal  PainSc: 0-No pain         Complications: No notable events documented.

## 2022-01-24 NOTE — Op Note (Signed)
[facility] Gastroenterology Patient Name: Kathryn Hogan Procedure Date: 01/24/2022 7:45 AM MRN: [id number] Account #: [id number] Date of Birth: 1970/10/05 Admit Type: Outpatient Age: 51 Room: [facility] ENDO ROOM 4 Gender: Female Note Status: Finalized Instrument Name: Jasper Riling [id number] Procedure:             Colonoscopy Indications:           Screening in patient at increased risk: Family history                         of 1st-degree relative with colorectal cancer Providers:             Jonathon Bellows MD, MD Referring MD:          Eros, MD (Referring MD) Medicines:             Monitored Anesthesia Care Complications:         No immediate complications. Procedure:             Pre-Anesthesia Assessment:                        - Prior to the procedure, a History and Physical was                         performed, and patient medications, allergies and                         sensitivities were reviewed. The patient's tolerance                         of previous anesthesia was reviewed.                        - The risks and benefits of the procedure and the                         sedation options and risks were discussed with the                         patient. All questions were answered and informed                         consent was obtained.                        - ASA Grade Assessment: II - A patient with mild                         systemic disease.                        After obtaining informed consent, the colonoscope was                         passed under direct vision. Throughout the procedure,                         the patient's blood pressure, pulse, and oxygen  saturations were monitored continuously. The                         Colonoscope was introduced through the anus and                         advanced to the the cecum, identified by the                         appendiceal orifice. The  colonoscopy was performed                         with ease. The patient tolerated the procedure well.                         The quality of the bowel preparation was excellent.                         The ileocecal valve, appendiceal orifice, and rectum                         were photographed. Findings:      The perianal and digital rectal examinations were normal.      A 4 mm polyp was found in the ascending colon. The polyp was sessile.       The polyp was removed with a jumbo cold forceps. Resection and retrieval       were complete.      A 5 mm polyp was found in the ascending colon. The polyp was sessile.       The polyp was removed with a cold snare. Resection and retrieval were       complete.      A 15 mm polyp was found in the transverse colon. The polyp was       pedunculated. The polyp was removed with a hot snare. Resection and       retrieval were complete.      Three sessile polyps were found in the descending colon. The polyps were       5 to 7 mm in size. These polyps were removed with a cold snare.       Resection and retrieval were complete.      A 10 mm polyp was found in the descending colon. The polyp was sessile.       The polyp was removed with a hot snare. Resection and retrieval were       complete.      The exam was otherwise without abnormality on direct and retroflexion       views. Impression:            - One 4 mm polyp in the ascending colon, removed with                         a jumbo cold forceps. Resected and retrieved.                        - One 5 mm polyp in the ascending colon, removed with                         a cold snare. Resected and retrieved.                        -  One 15 mm polyp in the transverse colon, removed                         with a hot snare. Resected and retrieved.                        - Three 5 to 7 mm polyps in the descending colon,                         removed with a cold snare. Resected and retrieved.                         - One 10 mm polyp in the descending colon, removed                         with a hot snare. Resected and retrieved.                        - The examination was otherwise normal on direct and                         retroflexion views. Recommendation:        - Discharge patient to home (with escort).                        - Resume previous diet.                        - Continue present medications.                        - Await pathology results.                        - Repeat colonoscopy in 3 years for surveillance. Procedure Code(s):     --- Professional ---                        [phone], Colonoscopy, flexible; with removal of                         tumor(s), polyp(s), or other lesion(s) by snare                         technique                        45380, 11, Colonoscopy, flexible; with biopsy, single                         or multiple Diagnosis Code(s):     --- Professional ---                        Z80.0, Family history of malignant neoplasm of                         digestive organs                        D12.2, Benign neoplasm of ascending colon  D12.3, Benign neoplasm of transverse colon (hepatic                         flexure or splenic flexure)                        D12.4, Benign neoplasm of descending colon CPT copyright 2022 American [employer] Association. All rights reserved. The codes documented in this report are preliminary and upon coder review may  be revised to meet current compliance requirements. Jonathon Bellows, MD Jonathon Bellows MD, MD 01/24/2022 8:23:05 AM This report has been signed electronically. Number of Addenda: 0 Note Initiated On: 01/24/2022 7:45 AM Scope Withdrawal Time: 0 hours 15 minutes 54 seconds  Total Procedure Duration: 0 hours 19 minutes 44 seconds  Estimated Blood Loss:  Estimated blood loss: none.      [facility]

## 2022-01-24 NOTE — Anesthesia Postprocedure Evaluation (Signed)
Anesthesia Post Note  Patient: Kathryn Hogan  Procedure(s) Performed: COLONOSCOPY WITH PROPOFOL  Patient location during evaluation: Endoscopy Anesthesia Type: General Level of consciousness: awake and alert Pain management: pain level controlled Vital Signs Assessment: post-procedure vital signs reviewed and stable Respiratory status: spontaneous breathing, nonlabored ventilation, respiratory function stable and patient connected to nasal cannula oxygen Cardiovascular status: blood pressure returned to baseline and stable Postop Assessment: no apparent nausea or vomiting Anesthetic complications: no   No notable events documented.   Last Vitals:  Vitals:   01/24/22 0834 01/24/22 0844  BP: (!) 102/59 134/74  Pulse: 95 90  Resp: 13 19  Temp:    SpO2: 99% 96%    Last Pain:  Vitals:   01/24/22 0844  TempSrc:   PainSc: 0-No pain                 Precious Haws Ryah Cribb

## 2022-01-24 NOTE — H&P (Signed)
Jonathon Bellows, MD [address], [place], [place], [place], [address] [address], [place], [place], [place], [address] Phone: [phone]  Fax: [phone]  Primary Care Physician:  [facility] [place]   Pre-Procedure History & Physical: HPI:  Kathryn Hogan is a 51 y.o. female is here for an colonoscopy.   Past Medical History:  Diagnosis Date   Anxiety    Diabetes mellitus without complication ([place])    Hypertension    Sleep apnea    does not use CPAP    Past Surgical History:  Procedure Laterality Date   EYE SURGERY     GAS/FLUID EXCHANGE Left 07/25/2017   Procedure: GAS/FLUID EXCHANGE;  Surgeon: Jalene Mullet, MD;  Location: [place];  Service: Ophthalmology;  Laterality: Left;  SF6   HERNIA REPAIR     PARS PLANA VITRECTOMY Left 07/25/2017   Procedure: PARS PLANA VITRECTOMY WITH 25 GAUGE LEFT EYE;  Surgeon: Jalene Mullet, MD;  Location: [place];  Service: Ophthalmology;  Laterality: Left;   PARS PLANA VITRECTOMY Right 08/15/2017   Procedure: PARS PLANA VITRECTOMY WITH 25 GAUGE MEMBRANE PILL WITH AIR GAS SILICONE OIL AND PHOTOCOAGULATION  RIGHT;  Surgeon: Jalene Mullet, MD;  Location: [place];  Service: Ophthalmology;  Laterality: Right;   PHOTOCOAGULATION WITH LASER Left 07/25/2017   Procedure: PHOTOCOAGULATION WITH LASER;  Surgeon: Jalene Mullet, MD;  Location: [place];  Service: Ophthalmology;  Laterality: Left;   TUBAL LIGATION     UMBILICAL HERNIA REPAIR N/A 08/02/2015   Procedure: HERNIA REPAIR UMBILICAL ADULT;  Surgeon: Christene Lye, MD;  Location: [facility];  Service: General;  Laterality: N/A;    Prior to Admission medications   Medication Sig Start Date End Date Taking? Authorizing Provider  atorvastatin (LIPITOR) 40 MG tablet Take 40 mg by mouth daily. 12/27/21  Yes [provider]  benazepril (LOTENSIN) 40 MG tablet Take 1 tablet (40 mg total) by mouth daily. 09/16/19  Yes Carlisle Cater, PA-C  insulin glargine (LANTUS) 100  unit/mL SOPN Inject 40 Units into the skin daily.   Yes [provider]  Insulin Pen Needle 31G X 5 MM MISC 1 Dose by Does not apply route 3 (three) times daily before meals. 11/07/19  Yes Wieting, Richard, MD  JARDIANCE 25 MG TABS tablet Take 25 mg by mouth daily. 12/27/21  Yes [provider]  metoprolol succinate (TOPROL-XL) 100 MG 24 hr tablet Take 100 mg by mouth daily. Take with or immediately following a meal.   Yes [provider]  acetaminophen (TYLENOL) 500 MG tablet Take 1,000 mg by mouth every 6 (six) hours as needed for moderate pain.    [provider]  methylPREDNISolone (MEDROL DOSEPAK) 4 MG TBPK tablet 6,5,4,3,2,1 Patient not taking: Reported on 01/24/2022 10/18/21   Lannie Fields, PA-C  Midazolam 5 MG/0.1ML SOLN Place 0.1 mLs into the nose as needed (for seizure lasting more than 5 mins.). For seizure activity lasting more than 5 mins, use metered spray of 0.1 mL containing 0.5 mg (a solution of 5 mg/mL), three to five times, per nostril, and repeat if necessary, to a total dose of 10 mg for adults and call 911 afterwards immediately. 11/07/19   Loletha Grayer, MD    Allergies as of 12/31/2021   (No Known Allergies)    Family History  Problem Relation Age of Onset   Breast cancer Sister 64   Esophageal cancer Mother    Colon cancer Father    Bone cancer Sister  Social History   Socioeconomic History   Marital status: Married    Spouse name: Not on file   Number of children: Not on file   Years of education: Not on file   Highest education level: Not on file  Occupational History   Not on file  Tobacco Use   Smoking status: Never   Smokeless tobacco: Never  Vaping Use   Vaping Use: Never used  Substance and Sexual Activity   Alcohol use: Yes    Alcohol/week: 0.0 standard drinks of alcohol    Comment: rare to occasional   Drug use: Not Currently   Sexual activity: Not on file  Other Topics Concern   Not on file  Social  History Narrative   Not on file   Social Determinants of Health   Financial Resource Strain: Not on file  Food Insecurity: Not on file  Transportation Needs: Not on file  Physical Activity: Not on file  Stress: Not on file  Social Connections: Not on file  Intimate Partner Violence: Not on file    Review of Systems: See HPI, otherwise negative ROS  Physical Exam: BP (!) 168/81   Pulse 89   Temp (!) 96.2 F (35.7 C) (Temporal)   Resp 18   Ht '5\' 1"'$  (1.549 m)   Wt 135.9 kg   LMP  (LMP Unknown) Comment: NO PERIOD FOR 1 YEAR PLUS.  SpO2 96%   BMI 56.61 kg/m  General:   Alert,  pleasant and cooperative in NAD Head:  Normocephalic and atraumatic. Neck:  Supple; no masses or thyromegaly. Lungs:  Clear throughout to auscultation, normal respiratory effort.    Heart:  +S1, +S2, Regular rate and rhythm, No edema. Abdomen:  Soft, nontender and nondistended. Normal bowel sounds, without guarding, and without rebound.   Neurologic:  Alert and  oriented x4;  grossly normal neurologically.  Impression/Plan: Kathryn Hogan is here for an colonoscopy to be performed for Screening colonoscopy , father had colon cancer.  Risks, benefits, limitations, and alternatives regarding  colonoscopy have been reviewed with the patient.  Questions have been answered.  All parties agreeable.   Jonathon Bellows, MD  01/24/2022, 7:47 AM

## 2022-01-25 ENCOUNTER — Encounter: Payer: Self-pay | Admitting: Gastroenterology

## 2022-01-25 LAB — SURGICAL PATHOLOGY

## 2022-01-28 ENCOUNTER — Encounter: Payer: Self-pay | Admitting: Gastroenterology

## 2022-04-14 IMAGING — DX DG CHEST 1V PORT
2 series · 2 of 2 positions shown · non-contrast
Comparison: None

CLINICAL DATA: Seizure.

EXAM:
PORTABLE CHEST 1 VIEW

[chest ap (1 of 2)]
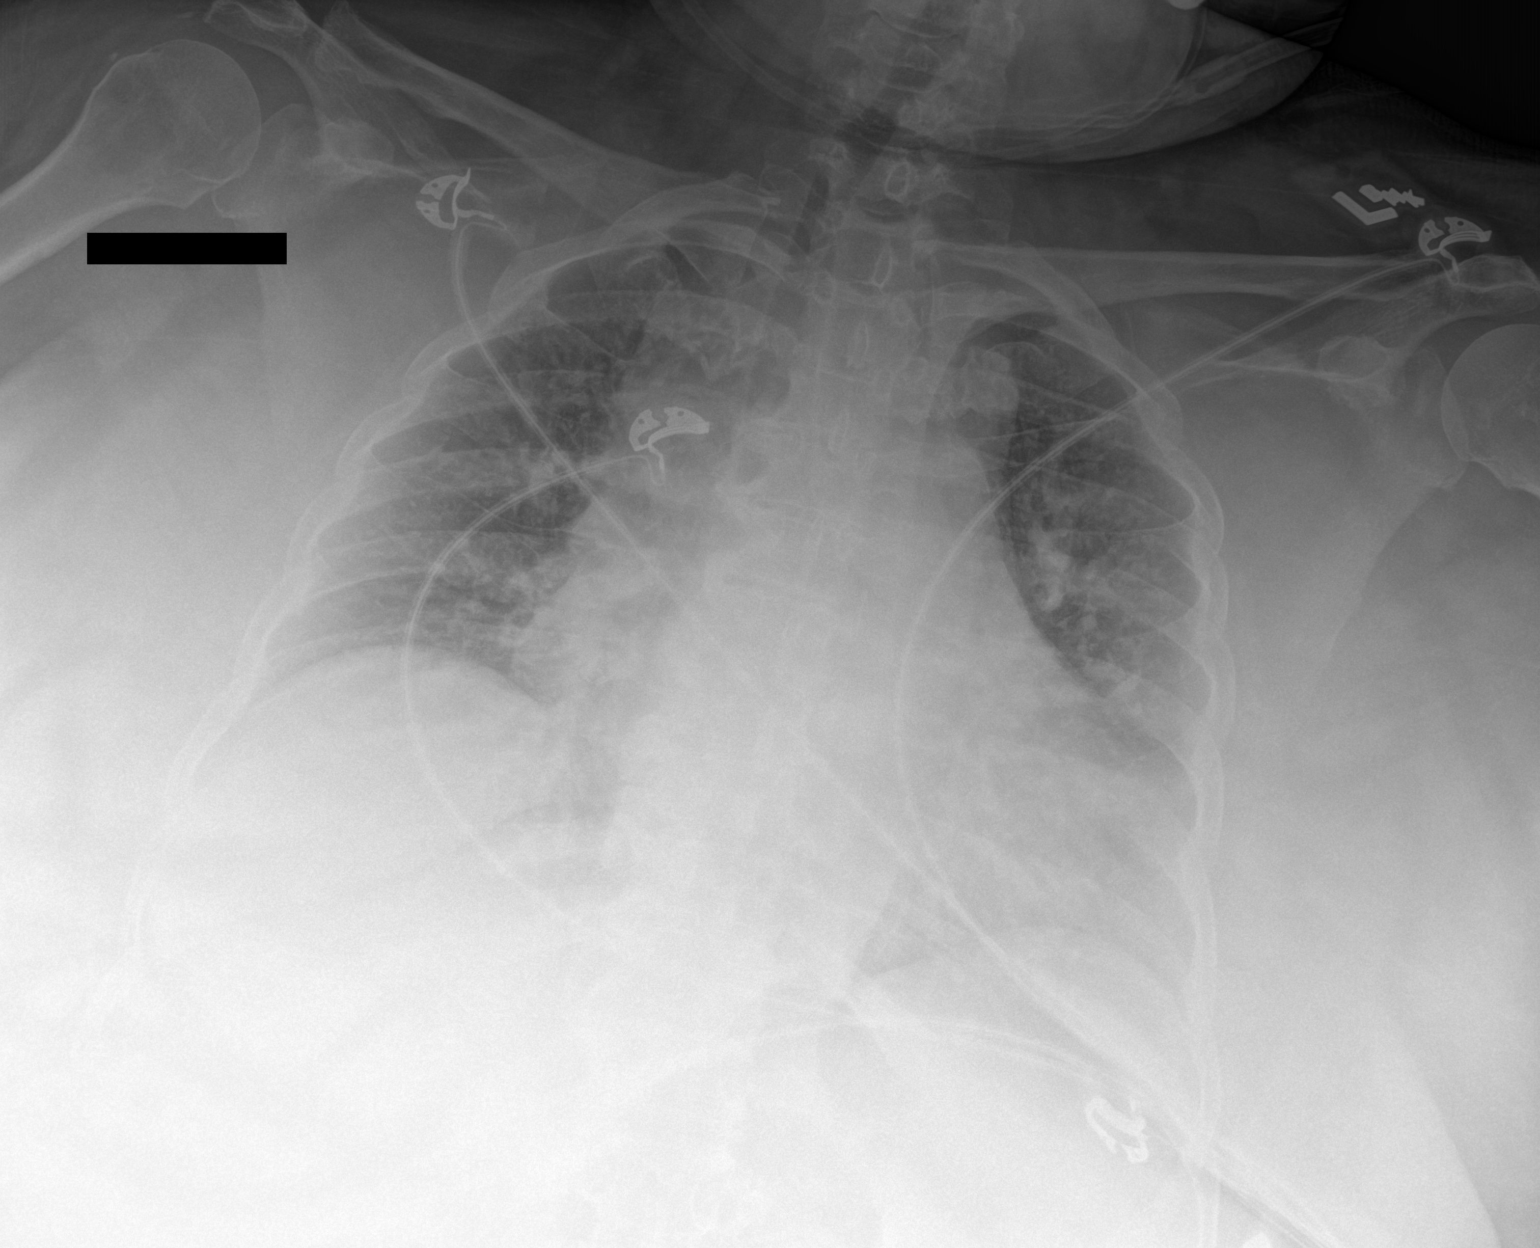

[chest ap (2 of 2)]
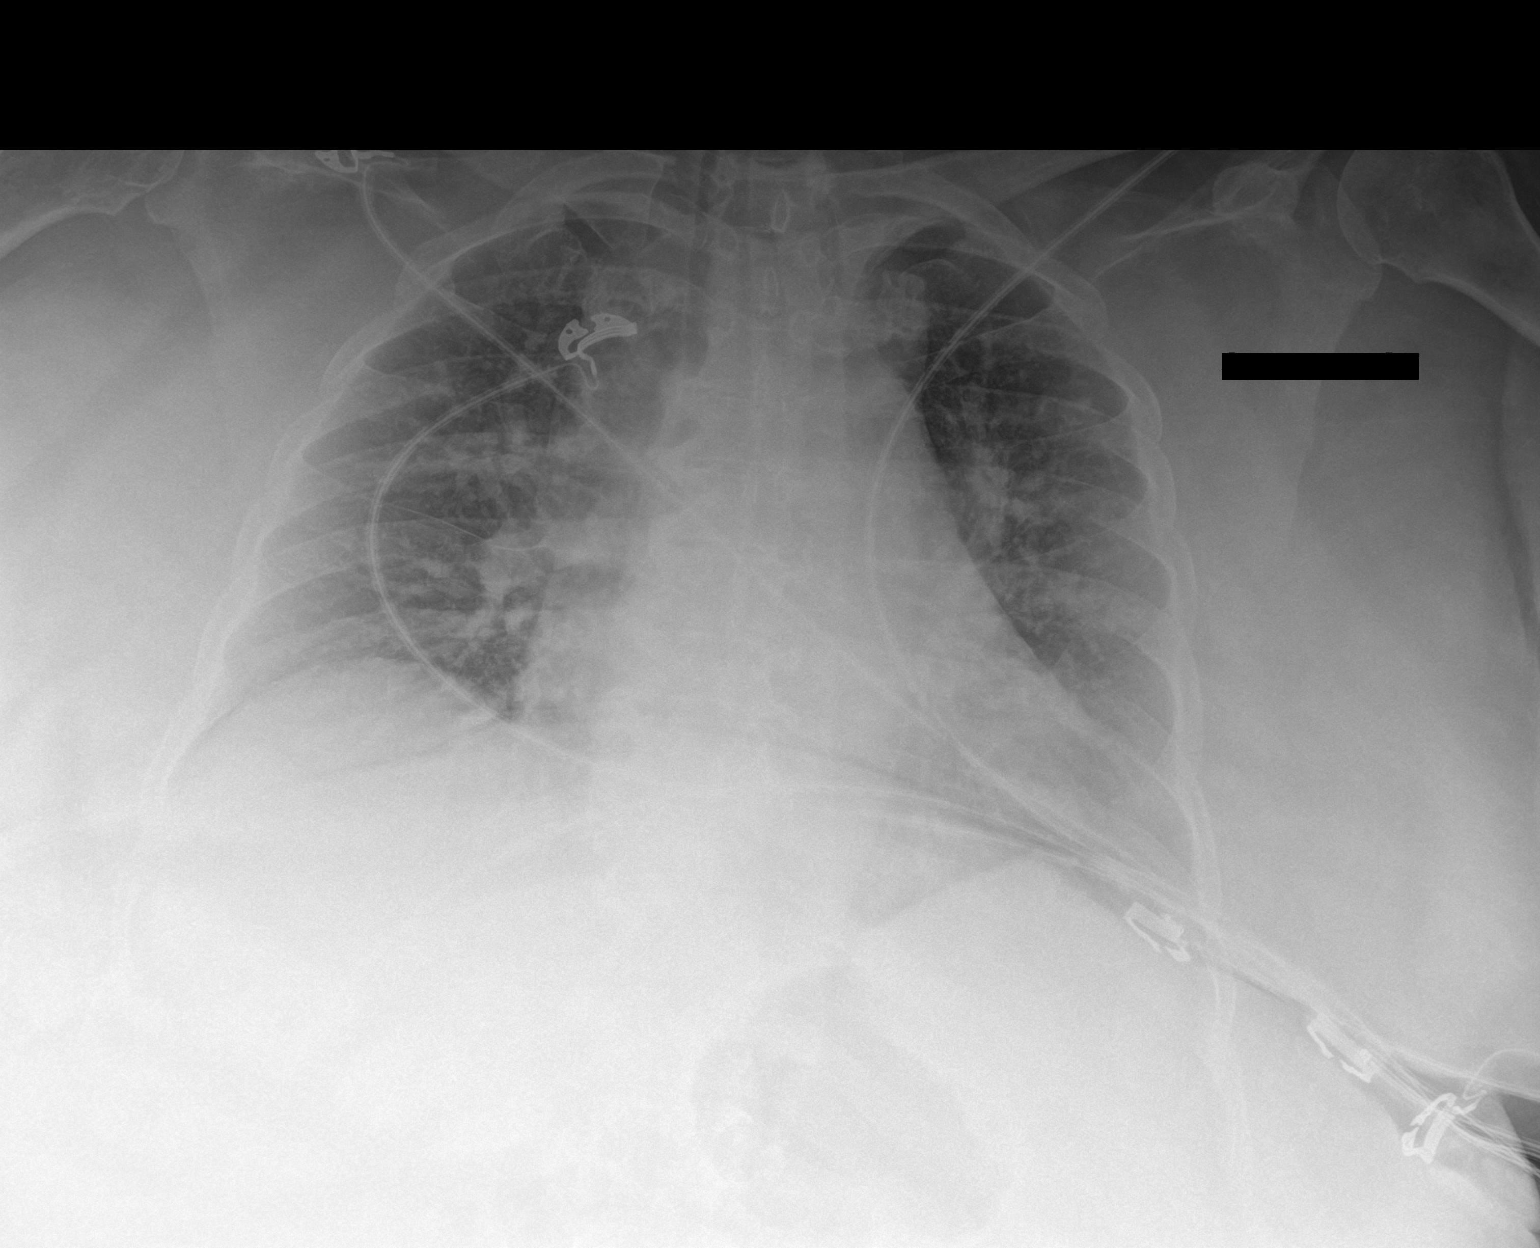

[2 of 2 positions shown; findings below may reference images not displayed]

FINDINGS: Cardiac enlargement. Decreased lung volumes with mild asymmetric
elevation of right hemidiaphragm no pleural effusion, interstitial
edema or airspace consolidation. Visualized osseous structures are
unremarkable.
IMPRESSION: 1. No acute cardiopulmonary abnormalities.
2. Cardiac enlargement.

## 2022-04-14 IMAGING — CT CT HEAD W/O CM
3 series · 16 of 47 positions shown, 19 images · non-contrast
Comparison: 09/16/2019

CLINICAL DATA: Encephalopathy

EXAM:
CT HEAD WITHOUT CONTRAST
TECHNIQUE: Contiguous axial images were obtained from the base of the skull
through the vertex without intravenous contrast.

[Series 2: head wo · axial · 0.45mm/px · z∈[-145,-10]mm · 10 of 33 slices shown, 13 images]
[im 3/33  brain]
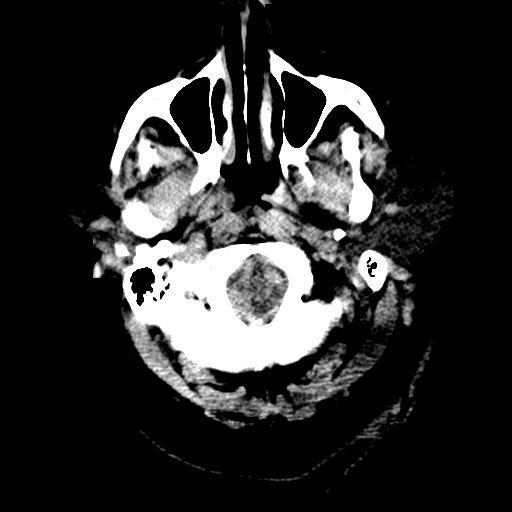
[im 3/33  bone]
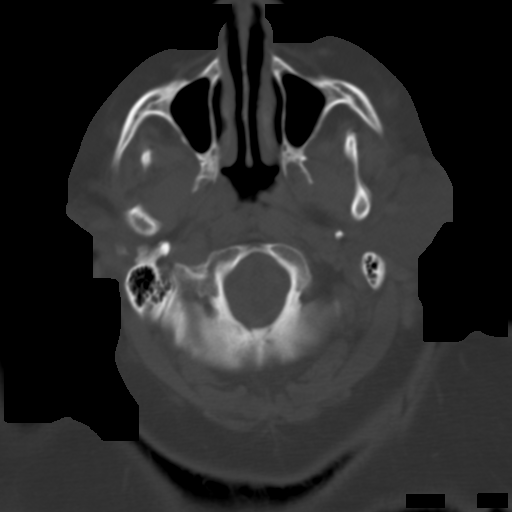
[im 6/33  brain]
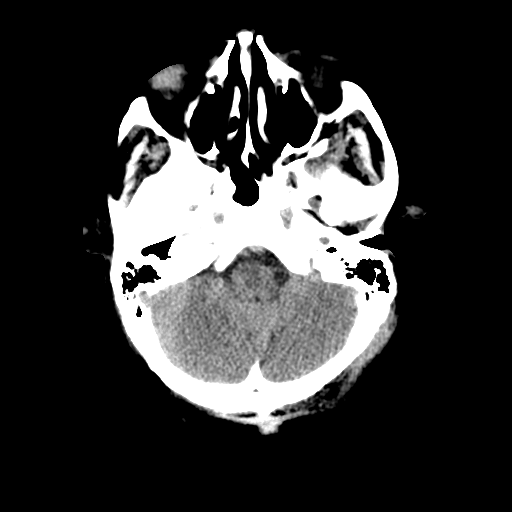
[im 9/33  brain]
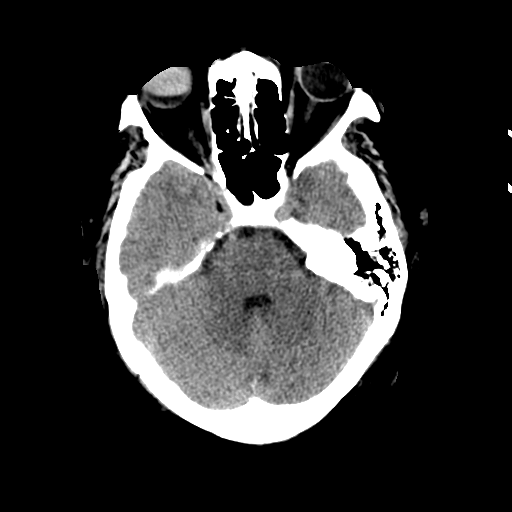
[im 12/33  brain]
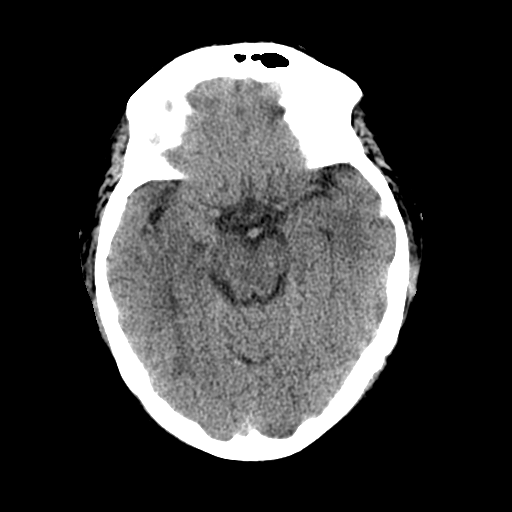
[im 15/33  brain]
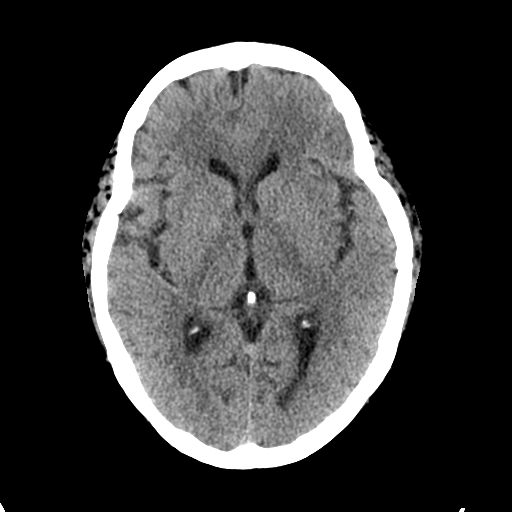
[im 15/33  bone]
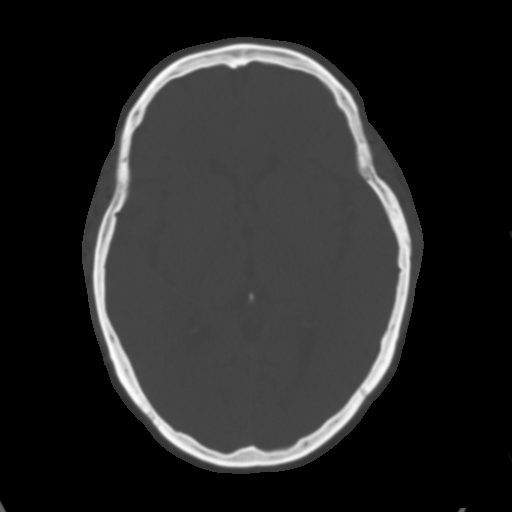
[im 18/33  brain]
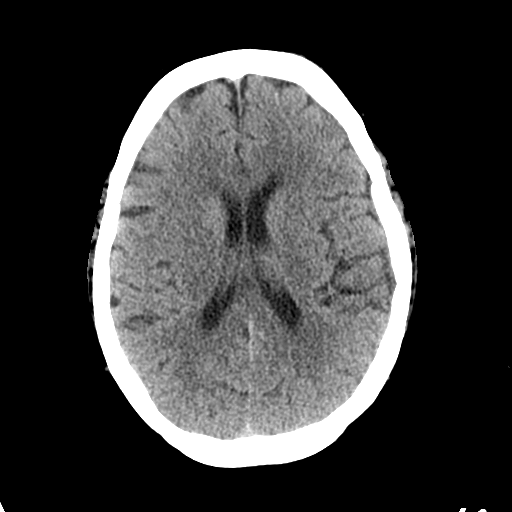
[im 21/33  brain]
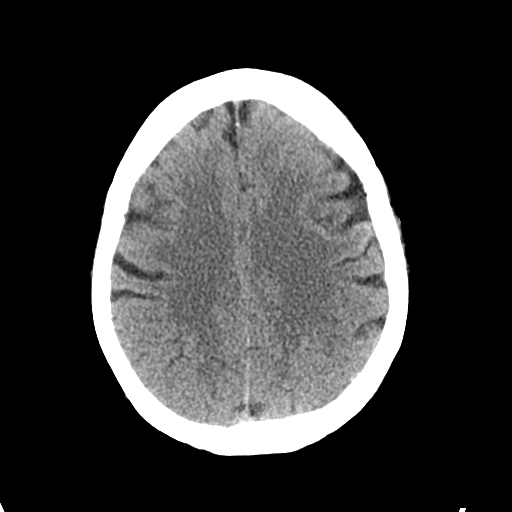
[im 25/33  brain]
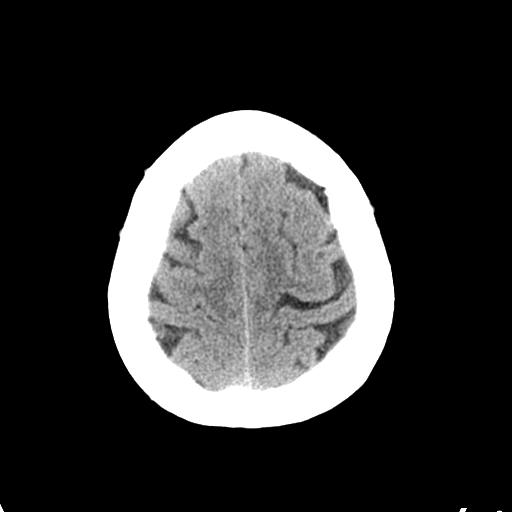
[im 27/33  brain]
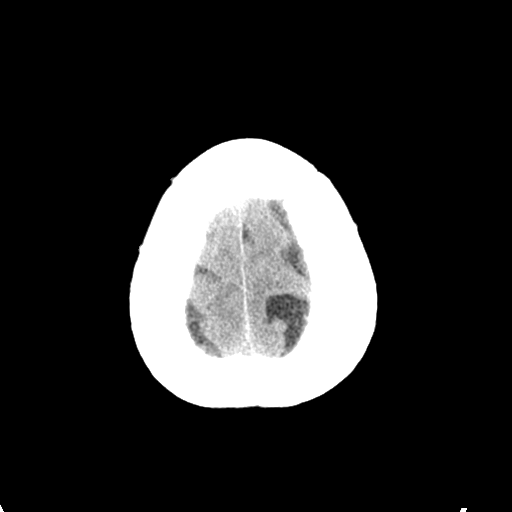
[im 27/33  bone]
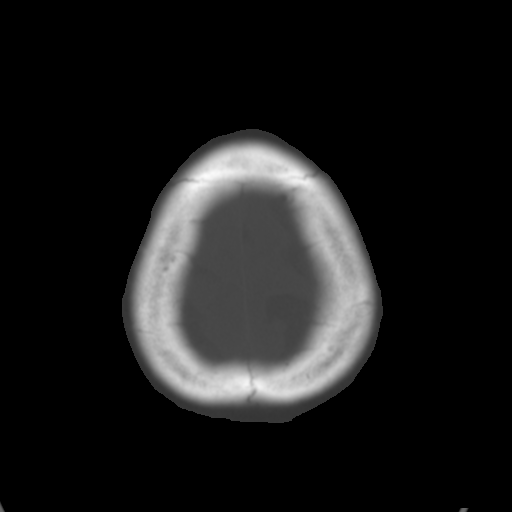
[im 30/33  brain]
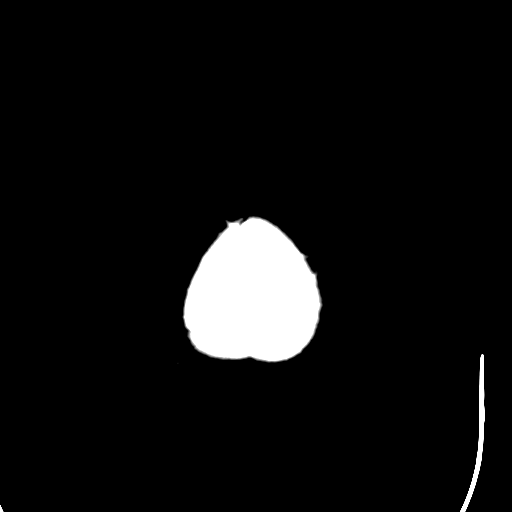

[Series 4: coronal soft tissue · coronal · 0.30mm/px · 3 of 66 slices shown]
[im 25/66  brain]
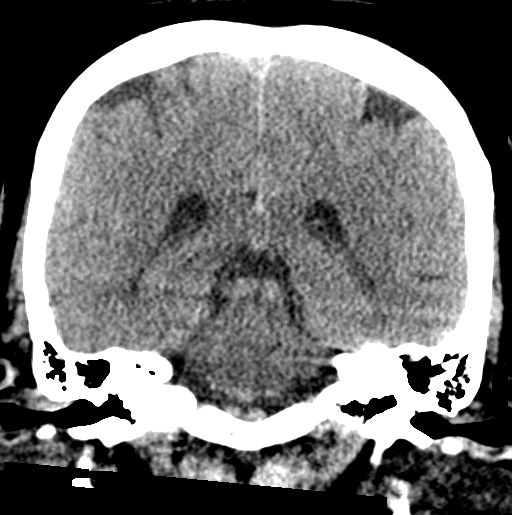
[im 30/66  brain]
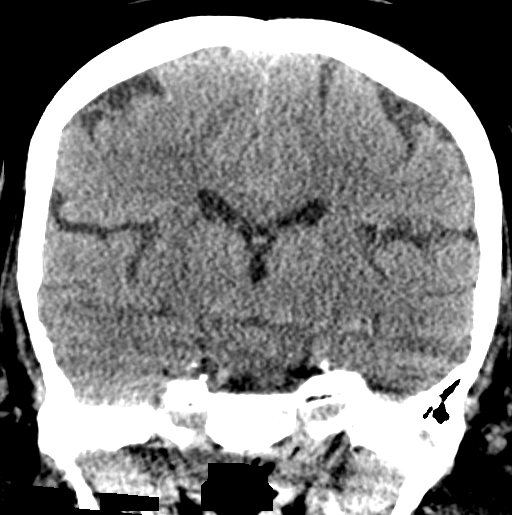
[im 36/66  brain]
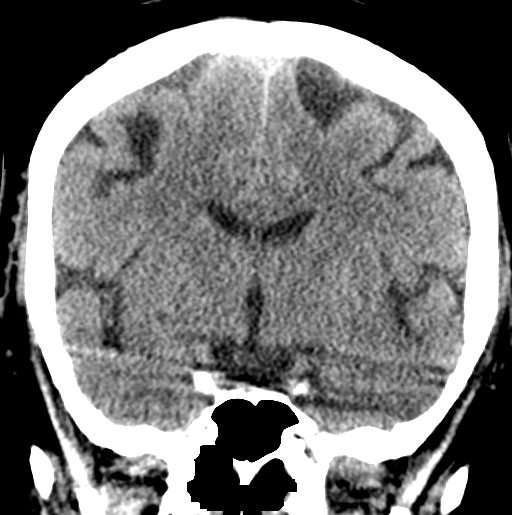

[Series 5: sagittal soft tissue · sagittal · 0.30mm/px · 3 of 52 slices shown]
[im 18/52  brain]
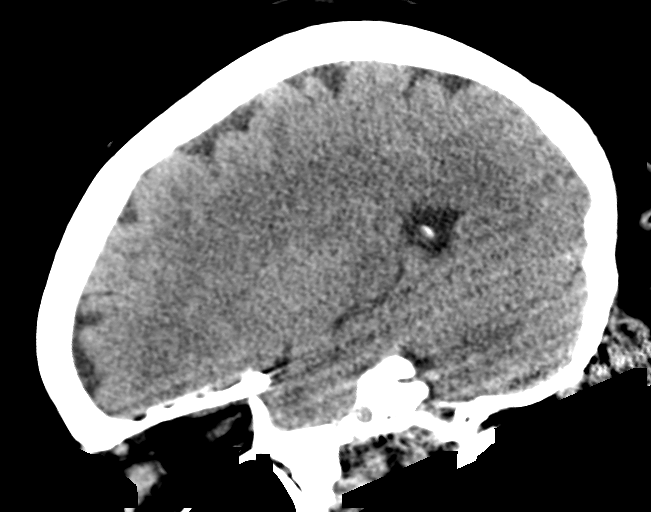
[im 26/52  brain]
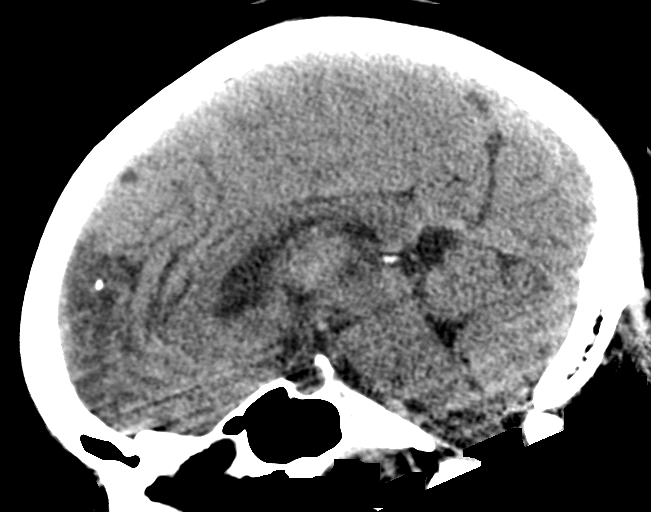
[im 35/52  brain]
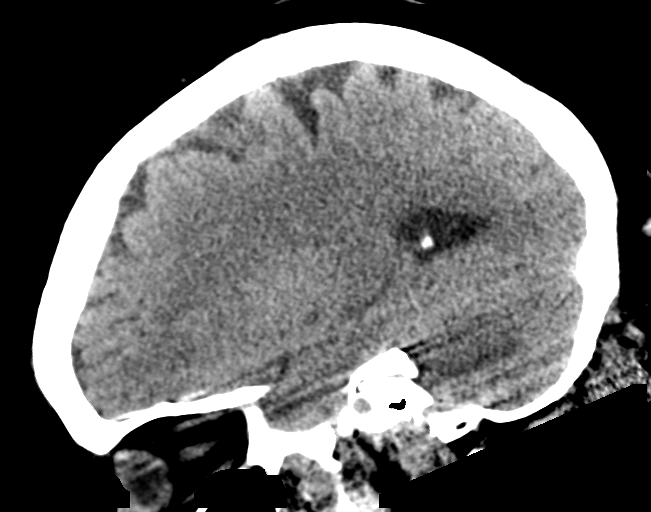

[16 of 47 positions shown; findings below may reference images not displayed]

FINDINGS: Brain: There is no mass, hemorrhage or extra-axial collection. The
size and configuration of the ventricles and extra-axial CSF spaces
are normal. The brain parenchyma is normal, without acute or chronic
infarction.

Vascular: No abnormal hyperdensity of the major intracranial
arteries or dural venous sinuses. No intracranial atherosclerosis.

Skull: The visualized skull base, calvarium and extracranial soft
tissues are normal.

Sinuses/Orbits: No fluid levels or advanced mucosal thickening of
the visualized paranasal sinuses. No mastoid or middle ear effusion.
Chronic right globe hyperattenuation.
IMPRESSION: Normal head CT.

## 2022-09-19 NOTE — Progress Notes (Deleted)
ANNUAL PREVENTATIVE CARE GYNECOLOGY  ENCOUNTER NOTE  Subjective:       Kathryn Hogan is a 52 y.o. No obstetric history on file. female here for a routine annual gynecologic exam. The patient {is/is not/has never been:13135} sexually active. The patient {is/is not:13135} taking hormone replacement therapy. {post-men bleed:13152::"Patient denies post-menopausal vaginal bleeding."} The patient wears seatbelts: {yes/no:311178}. The patient participates in regular exercise: {yes/no/not asked:9010}. Has the patient ever been transfused or tattooed?: {yes/no/not asked:9010}. The patient reports that there {is/is not:9024} domestic violence in her life.  Current complaints: 1.  She has been some postmenopausal bleeding.    Gynecologic History No LMP recorded. Contraception: {method:5051} Last Pap: ***. Results were: {norm/abn:16337} Last mammogram: ***. Results were: {norm/abn:16337} Last Colonoscopy:  Last Dexa Scan:    Obstetric History OB History  No obstetric history on file.    Past Medical History:  Diagnosis Date   Anxiety    Diabetes mellitus without complication (HCC)    Hypertension    Sleep apnea    does not use CPAP    Family History  Problem Relation Age of Onset   Breast cancer Sister 46   Esophageal cancer Mother    Colon cancer Father    Bone cancer Sister     Past Surgical History:  Procedure Laterality Date   COLONOSCOPY WITH PROPOFOL N/A 01/24/2022   Procedure: COLONOSCOPY WITH PROPOFOL;  Surgeon: Wyline Mood, MD;  Location: [facility] ENDOSCOPY;  Service: Gastroenterology;  Laterality: N/A;   EYE SURGERY     GAS/FLUID EXCHANGE Left 07/25/2017   Procedure: GAS/FLUID EXCHANGE;  Surgeon: Carmela Rima, MD;  Location: [facility] OR;  Service: Ophthalmology;  Laterality: Left;  SF6   HERNIA REPAIR     PARS PLANA VITRECTOMY Left 07/25/2017   Procedure: PARS PLANA VITRECTOMY WITH 25 GAUGE LEFT EYE;  Surgeon: Carmela Rima, MD;  Location: [facility] OR;  Service:  Ophthalmology;  Laterality: Left;   PARS PLANA VITRECTOMY Right 08/15/2017   Procedure: PARS PLANA VITRECTOMY WITH 25 GAUGE MEMBRANE PILL WITH AIR GAS SILICONE OIL AND PHOTOCOAGULATION  RIGHT;  Surgeon: Carmela Rima, MD;  Location: [facility] OR;  Service: Ophthalmology;  Laterality: Right;   PHOTOCOAGULATION WITH LASER Left 07/25/2017   Procedure: PHOTOCOAGULATION WITH LASER;  Surgeon: Carmela Rima, MD;  Location: [facility] OR;  Service: Ophthalmology;  Laterality: Left;   TUBAL LIGATION     UMBILICAL HERNIA REPAIR N/A 08/02/2015   Procedure: HERNIA REPAIR UMBILICAL ADULT;  Surgeon: Kieth Brightly, MD;  Location: [facility] ORS;  Service: General;  Laterality: N/A;    Social History   Socioeconomic History   Marital status: Married    Spouse name: Not on file   Number of children: Not on file   Years of education: Not on file   Highest education level: Not on file  Occupational History   Not on file  Tobacco Use   Smoking status: Never   Smokeless tobacco: Never  Vaping Use   Vaping status: Never Used  Substance and Sexual Activity   Alcohol use: Yes    Alcohol/week: 0.0 standard drinks of alcohol    Comment: rare to occasional   Drug use: Not Currently   Sexual activity: Not on file  Other Topics Concern   Not on file  Social History Narrative   Not on file   Social Determinants of Health   Financial Resource Strain: Not on file  Food Insecurity: Not on file  Transportation Needs: Not on file  Physical Activity: Not on file  Stress: Not on file  Social Connections: Not on file  Intimate Partner Violence: Not on file    Current Outpatient Medications on File Prior to Visit  Medication Sig Dispense Refill   acetaminophen (TYLENOL) 500 MG tablet Take 1,000 mg by mouth every 6 (six) hours as needed for moderate pain.     atorvastatin (LIPITOR) 40 MG tablet Take 40 mg by mouth daily.     benazepril (LOTENSIN) 40 MG tablet Take 1 tablet (40 mg total) by mouth daily. 30 tablet 1    insulin glargine (LANTUS) 100 unit/mL SOPN Inject 40 Units into the skin daily.     Insulin Pen Needle 31G X 5 MM MISC 1 Dose by Does not apply route 3 (three) times daily before meals. 100 each 0   JARDIANCE 25 MG TABS tablet Take 25 mg by mouth daily.     methylPREDNISolone (MEDROL DOSEPAK) 4 MG TBPK tablet 6,5,4,3,2,1 (Patient not taking: Reported on 01/24/2022) 21 tablet 0   metoprolol succinate (TOPROL-XL) 100 MG 24 hr tablet Take 100 mg by mouth daily. Take with or immediately following a meal.     Midazolam 5 MG/0.1ML SOLN Place 0.1 mLs into the nose as needed (for seizure lasting more than 5 mins.). For seizure activity lasting more than 5 mins, use metered spray of 0.1 mL containing 0.5 mg (a solution of 5 mg/mL), three to five times, per nostril, and repeat if necessary, to a total dose of 10 mg for adults and call 911 afterwards immediately. 1 each 0   No current facility-administered medications on file prior to visit.    No Known Allergies    Review of Systems ROS Review of Systems - General ROS: negative for - chills, fatigue, fever, hot flashes, night sweats, weight gain or weight loss Psychological ROS: negative for - anxiety, decreased libido, depression, mood swings, physical abuse or sexual abuse Ophthalmic ROS: negative for - blurry vision, eye pain or loss of vision ENT ROS: negative for - headaches, hearing change, visual changes or vocal changes Allergy and Immunology ROS: negative for - hives, itchy/watery eyes or seasonal allergies Hematological and Lymphatic ROS: negative for - bleeding problems, bruising, swollen lymph nodes or weight loss Endocrine ROS: negative for - galactorrhea, hair pattern changes, hot flashes, malaise/lethargy, mood swings, palpitations, polydipsia/polyuria, skin changes, temperature intolerance or unexpected weight changes Breast ROS: negative for - new or changing breast lumps or nipple discharge Respiratory ROS: negative for - cough or  shortness of breath Cardiovascular ROS: negative for - chest pain, irregular heartbeat, palpitations or shortness of breath Gastrointestinal ROS: no abdominal pain, change in bowel habits, or black or bloody stools Genito-Urinary ROS: no dysuria, trouble voiding, or hematuria Musculoskeletal ROS: negative for - joint pain or joint stiffness Neurological ROS: negative for - bowel and bladder control changes Dermatological ROS: negative for rash and skin lesion changes   Objective:   There were no vitals taken for this visit. CONSTITUTIONAL: Well-developed, well-nourished female in no acute distress.  PSYCHIATRIC: Normal mood and affect. Normal behavior. Normal judgment and thought content. NEUROLGIC: Alert and oriented to person, place, and time. Normal muscle tone coordination. No cranial nerve deficit noted. HENT:  Normocephalic, atraumatic, External right and left ear normal. Oropharynx is clear and moist EYES: Conjunctivae and EOM are normal. Pupils are equal, round, and reactive to light. No scleral icterus.  NECK: Normal range of motion, supple, no masses.  Normal thyroid.  SKIN: Skin is warm and dry. No rash noted. Not diaphoretic.  No erythema. No pallor. CARDIOVASCULAR: Normal heart rate noted, regular rhythm, no murmur. RESPIRATORY: Clear to auscultation bilaterally. Effort and breath sounds normal, no problems with respiration noted. BREASTS: Symmetric in size. No masses, skin changes, nipple drainage, or lymphadenopathy. ABDOMEN: Soft, normal bowel sounds, no distention noted.  No tenderness, rebound or guarding.  BLADDER: Normal PELVIC:  Bladder {:311640}  Urethra: {:311719}  Vulva: {:311722}  Vagina: {:311643}  Cervix: {:311644}  Uterus: {:311718}  Adnexa: {:311645}  RV: {Blank multiple:19196::"External Exam NormaI","No Rectal Masses","Normal Sphincter tone"}  MUSCULOSKELETAL: Normal range of motion. No tenderness.  No cyanosis, clubbing, or edema.  2+ distal  pulses. LYMPHATIC: No Axillary, Supraclavicular, or Inguinal Adenopathy.   Labs: Lab Results  Component Value Date   WBC 11.5 (H) 11/07/2019   HGB 10.8 (L) 11/07/2019   HCT 32.8 (L) 11/07/2019   MCV 82.2 11/07/2019   PLT 256 11/07/2019    Lab Results  Component Value Date   CREATININE 1.62 (H) 11/07/2019   BUN 27 (H) 11/07/2019   NA 136 11/07/2019   K 3.9 11/07/2019   CL 100 11/07/2019   CO2 29 11/07/2019    Lab Results  Component Value Date   ALT 17 11/06/2019   AST 22 11/06/2019   ALKPHOS 95 11/06/2019   BILITOT 0.7 11/06/2019    No results found for: "CHOL", "HDL", "LDLCALC", "LDLDIRECT", "TRIG", "CHOLHDL"  No results found for: "TSH"  Lab Results  Component Value Date   HGBA1C 11.5 (H) 11/06/2019     Assessment:   No diagnosis found.   Plan:  Pap: {Blank multiple:19196::"Pap, Reflex if ASCUS","Pap Co Test","GC/CT NAAT","Not needed","Not done"} Mammogram: {Blank multiple:19196::"***","Ordered","Not Ordered","Not Indicated"} Colon Screening:  {Blank multiple:19196::"***","Ordered","Not Ordered","Not Indicated"} Labs: {Blank multiple:19196::"Lipid 1","FBS","TSH","Hemoglobin A1C","Vit D Level""***"} Routine preventative health maintenance measures emphasized: {Blank multiple:19196::"Exercise/Diet/Weight control","Tobacco Warnings","Alcohol/Substance use risks","Stress Management","Peer Pressure Issues","Safe Sex"} COVID Vaccination status: Return to Clinic - 1 Year   [employer], [place] [facility]

## 2022-09-20 ENCOUNTER — Other Ambulatory Visit: Payer: Self-pay | Admitting: Obstetrics and Gynecology

## 2022-09-20 ENCOUNTER — Telehealth: Payer: Self-pay | Admitting: Obstetrics and Gynecology

## 2022-09-20 ENCOUNTER — Encounter: Payer: Medicaid Other | Admitting: Obstetrics and Gynecology

## 2022-09-20 ENCOUNTER — Other Ambulatory Visit: Payer: Medicaid Other

## 2022-09-20 DIAGNOSIS — N950 Postmenopausal bleeding: Secondary | ICD-10-CM

## 2022-09-20 NOTE — Telephone Encounter (Signed)
Kathryn Hogan contacted pt to reschedule [place] appt that was scheduled on 09/20/2022 at 1:00.  Pt was rescheduled to 10/17/2022 for [place].

## 2022-10-17 ENCOUNTER — Ambulatory Visit: Payer: Medicaid Other | Admitting: Obstetrics and Gynecology

## 2022-10-17 ENCOUNTER — Other Ambulatory Visit: Payer: Self-pay | Admitting: Obstetrics and Gynecology

## 2022-10-17 ENCOUNTER — Encounter: Payer: Self-pay | Admitting: Obstetrics and Gynecology

## 2022-10-17 ENCOUNTER — Ambulatory Visit (INDEPENDENT_AMBULATORY_CARE_PROVIDER_SITE_OTHER): Payer: Medicaid Other

## 2022-10-17 ENCOUNTER — Other Ambulatory Visit (HOSPITAL_COMMUNITY)
Admission: RE | Admit: 2022-10-17 | Discharge: 2022-10-17 | Disposition: A | Discharge status: Home / Self Care | Payer: Medicaid Other | Source: Ambulatory Visit | Attending: Obstetrics and Gynecology | Admitting: Obstetrics and Gynecology

## 2022-10-17 VITALS — BP 108/66 | HR 79 | Resp 16 | Ht 61.0 in | Wt 306.2 lb

## 2022-10-17 DIAGNOSIS — Z6841 Body Mass Index (BMI) 40.0 and over, adult: Secondary | ICD-10-CM | POA: Diagnosis not present

## 2022-10-17 DIAGNOSIS — N950 Postmenopausal bleeding: Secondary | ICD-10-CM | POA: Diagnosis not present

## 2022-10-17 DIAGNOSIS — Z1231 Encounter for screening mammogram for malignant neoplasm of breast: Secondary | ICD-10-CM

## 2022-10-17 DIAGNOSIS — Z124 Encounter for screening for malignant neoplasm of cervix: Secondary | ICD-10-CM | POA: Diagnosis present

## 2022-10-17 DIAGNOSIS — N898 Other specified noninflammatory disorders of vagina: Secondary | ICD-10-CM

## 2022-10-17 DIAGNOSIS — N951 Menopausal and female climacteric states: Secondary | ICD-10-CM

## 2022-10-17 DIAGNOSIS — N939 Abnormal uterine and vaginal bleeding, unspecified: Secondary | ICD-10-CM

## 2022-10-17 MED ORDER — PREMARIN 0.625 MG/GM VA CREA: TOPICAL_CREAM | VAGINAL | 6 refills | Status: DC

## 2022-10-17 NOTE — Progress Notes (Signed)
Patient presents for evaluation of PMB.  Missed ultrasound appointment scheduled earlier today.   Loney Laurence, CMA

## 2022-10-17 NOTE — Progress Notes (Signed)
Subjective:    Kathryn Hogan is a 52 y.o., post-menopausal female who presents for concerns regarding postmenopausal bleeding. She has been menopausal for 2 years. She has been . Bleeding is described as  spotting or light bleeding with wiping, but has been occurring monthly since April.   Denies passage of clots or pelvic cramping. Workup to date: none.  Menstrual History: OB History     Gravida  6   Para  4   Term  4   Preterm      AB  2   Living  4      SAB      IAB      Ectopic      Multiple      Live Births              Menarche age: 74 No LMP recorded (lmp unknown). Patient is postmenopausal    Gynecologic History No LMP recorded. Contraception: post menopausal status Last Pap: UNKNOWN Last mammogram: 05/05/2015. Results were: normal Last Colonoscopy: 01/24/2022: Recommend repeat in 3 YEARS Last Dexa Scan: Never done   Past Medical History:  Diagnosis Date   Anxiety    Diabetes mellitus without complication (HCC)    Hypertension    Sleep apnea    does not use CPAP    Past Surgical History:  Procedure Laterality Date   COLONOSCOPY WITH PROPOFOL N/A 01/24/2022   Procedure: COLONOSCOPY WITH PROPOFOL;  Surgeon: Wyline Mood, MD;  Location: [facility] ENDOSCOPY;  Service: Gastroenterology;  Laterality: N/A;   EYE SURGERY     GAS/FLUID EXCHANGE Left 07/25/2017   Procedure: GAS/FLUID EXCHANGE;  Surgeon: Carmela Rima, MD;  Location: [facility] OR;  Service: Ophthalmology;  Laterality: Left;  SF6   HERNIA REPAIR     PARS PLANA VITRECTOMY Left 07/25/2017   Procedure: PARS PLANA VITRECTOMY WITH 25 GAUGE LEFT EYE;  Surgeon: Carmela Rima, MD;  Location: [facility] OR;  Service: Ophthalmology;  Laterality: Left;   PARS PLANA VITRECTOMY Right 08/15/2017   Procedure: PARS PLANA VITRECTOMY WITH 25 GAUGE MEMBRANE PILL WITH AIR GAS SILICONE OIL AND PHOTOCOAGULATION  RIGHT;  Surgeon: Carmela Rima, MD;  Location: [facility] OR;  Service: Ophthalmology;  Laterality: Right;    PHOTOCOAGULATION WITH LASER Left 07/25/2017   Procedure: PHOTOCOAGULATION WITH LASER;  Surgeon: Carmela Rima, MD;  Location: [facility] OR;  Service: Ophthalmology;  Laterality: Left;   TUBAL LIGATION     UMBILICAL HERNIA REPAIR N/A 08/02/2015   Procedure: HERNIA REPAIR UMBILICAL ADULT;  Surgeon: Kieth Brightly, MD;  Location: [facility] ORS;  Service: General;  Laterality: N/A;    Family History  Problem Relation Age of Onset   Breast cancer Sister 90   Esophageal cancer Mother    Colon cancer Father    Bone cancer Sister     Past Surgical History:  Procedure Laterality Date   COLONOSCOPY WITH PROPOFOL N/A 01/24/2022   Procedure: COLONOSCOPY WITH PROPOFOL;  Surgeon: Wyline Mood, MD;  Location: [facility] ENDOSCOPY;  Service: Gastroenterology;  Laterality: N/A;   EYE SURGERY     GAS/FLUID EXCHANGE Left 07/25/2017   Procedure: GAS/FLUID EXCHANGE;  Surgeon: Carmela Rima, MD;  Location: [facility] OR;  Service: Ophthalmology;  Laterality: Left;  SF6   HERNIA REPAIR     PARS PLANA VITRECTOMY Left 07/25/2017   Procedure: PARS PLANA VITRECTOMY WITH 25 GAUGE LEFT EYE;  Surgeon: Carmela Rima, MD;  Location: [facility] OR;  Service: Ophthalmology;  Laterality: Left;   PARS PLANA VITRECTOMY Right 08/15/2017   Procedure: PARS  PLANA VITRECTOMY WITH 25 GAUGE MEMBRANE PILL WITH AIR GAS SILICONE OIL AND PHOTOCOAGULATION  RIGHT;  Surgeon: Carmela Rima, MD;  Location: [facility] OR;  Service: Ophthalmology;  Laterality: Right;   PHOTOCOAGULATION WITH LASER Left 07/25/2017   Procedure: PHOTOCOAGULATION WITH LASER;  Surgeon: Carmela Rima, MD;  Location: [facility] OR;  Service: Ophthalmology;  Laterality: Left;   TUBAL LIGATION     UMBILICAL HERNIA REPAIR N/A 08/02/2015   Procedure: HERNIA REPAIR UMBILICAL ADULT;  Surgeon: Kieth Brightly, MD;  Location: [facility] ORS;  Service: General;  Laterality: N/A;    Current Outpatient Medications on File Prior to Visit  Medication Sig Dispense Refill   acetaminophen (TYLENOL) 500 MG  tablet Take 1,000 mg by mouth every 6 (six) hours as needed for moderate pain.     atorvastatin (LIPITOR) 40 MG tablet Take 40 mg by mouth daily.     benazepril (LOTENSIN) 40 MG tablet Take 1 tablet (40 mg total) by mouth daily. 30 tablet 1   insulin glargine (LANTUS) 100 unit/mL SOPN Inject 40 Units into the skin daily.     Insulin Pen Needle 31G X 5 MM MISC 1 Dose by Does not apply route 3 (three) times daily before meals. 100 each 0   JARDIANCE 25 MG TABS tablet Take 25 mg by mouth daily.     methylPREDNISolone (MEDROL DOSEPAK) 4 MG TBPK tablet 6,5,4,3,2,1 (Patient not taking: Reported on 01/24/2022) 21 tablet 0   metoprolol succinate (TOPROL-XL) 100 MG 24 hr tablet Take 100 mg by mouth daily. Take with or immediately following a meal.     Midazolam 5 MG/0.1ML SOLN Place 0.1 mLs into the nose as needed (for seizure lasting more than 5 mins.). For seizure activity lasting more than 5 mins, use metered spray of 0.1 mL containing 0.5 mg (a solution of 5 mg/mL), three to five times, per nostril, and repeat if necessary, to a total dose of 10 mg for adults and call 911 afterwards immediately. 1 each 0   No current facility-administered medications on file prior to visit.    No Known Allergies   Review of Systems Pertinent items noted in HPI and remainder of comprehensive ROS otherwise negative.    Objective:    BP 108/66   Pulse 79   Resp 16   Ht 5\' 1"  (1.549 m)   Wt (!) 306 lb 3.2 oz (138.9 kg)   LMP  (LMP Unknown)   BMI 57.86 kg/m  General appearance: alert, cooperative, no distress, and morbidly obese Abdomen: soft, non-tender; bowel sounds normal; no masses,  no organomegaly Pelvic: external genitalia normal, rectovaginal septum normal.  Vagina without discharge.  Cervix normal appearing, no lesions and no motion tenderness.  Uterus difficult to palpate due to body habitus.  Adnexae non-palpable, due to body habitus, non-tender. Extremities: extremities normal, atraumatic, no  cyanosis or edema Neurologic: Grossly normal    Imaging:  ULTRASOUND REPORT   Location: [facility] Date of Service: 10/17/2022        Indications:Abnormal Uterine Bleeding Findings:  The uterus is anteverted and measures 9.28 x 4.73 x 4.40 cm. Echo texture is homogenous without evidence of focal masses.     The Endometrium measures 1.88 mm.   Right Ovary not well seen Left Ovary not well seen Survey of the adnexa demonstrates no adnexal masses. There is no free fluid in the cul de sac.   Impression: 1. Limited visibility due to body habitus 2. WNL uterus   Recommendations: 1.Clinical correlation with  the patient's History and Physical Exam. 2. Follow up with provider for recommendations    Waldo Laine, RT   Assessment:   1. PMB (postmenopausal bleeding)   2. Morbid obesity with BMI of 50.0-59.9, adult (HCC)   3. Breast cancer screening by mammogram   4. Screening for cervical cancer   5. Vaginal dryness, menopausal     Plan:   1. PMB (postmenopausal bleeding) - Discussed etiologies of postmenopausal bleeding, concern about precancerous/hyperplasia or cancerous etiology (5 to 10% percent of cases). Also discussed role of unopposed estrogen exposure in leading to thickened or proliferative endometrium; and its possible correlation with endometrial hyperplasia/carcinoma.  Discussed that obesity is linked to endometrial pathology given that adipose cells produce extra estrogen (estrone) which can cause the endometrium to have a significant amount of estrogen exposure.  However, she was reassured that endometrial atrophy and endometrial polyps are the most common causes of postmenopausal bleeding.  Uterine bleeding in postmenopausal women is usually light and self-limited. Exclusion of cancer is the main objective; therefore, treatment is usually unnecessary once cancer has been excluded.  The primary goal in the diagnostic evaluation of postmenopausal  women with uterine bleeding is to exclude malignancy; this can include endometrial biopsy and pelvic ultrasound.  Ultrasound performed today, noting thin endometrial stripe with no intrauterine masses.  Most likely consistent with uterine atrophy..  - Pap smear performed today as patient overdue for routine screening, and in setting of PMB.  - FSH ordered today to confirm menopausal status.   2. Morbid obesity with BMI of 50.0-59.9, adult (HCC) - See above for increased risk of unopposed estrogen, at risk due to BMI.   3. Breast cancer screening by mammogram - Patient overdue for breast screening, mammogram ordered by PCP, currently scheduled for 10/25/2022.   4. Vaginal dryness -On further discussion with patient after review of ultrasound results, patient reports that she does e xperience some vaginal dryness.  Notes that she also has some vaginal irritation from time to time, as well as dryness with intercourse however is not regularly sexually active.  I discussed that this could be another cause of her vaginal spotting or light bleeding.  Can help to manage with use of local estrogen therapy which can be prescribed to her.  Also note that this may help with her uterine atrophy as well.  Given prescription for Premarin cream to utilize twice weekly.  Will follow-up in 6 to 8 weeks.  A total of 45 minutes were spent during this encounter, including review of previous progress notes, recent imaging and labs, face-to-face with time with patient involving counseling and coordination of care, as well as documentation for current visit.   Hildred Laser, MD [facility]

## 2022-10-18 LAB — FOLLICLE STIMULATING HORMONE: FSH: 47 m[IU]/mL

## 2022-10-24 LAB — CYTOLOGY - PAP
Comment: NEGATIVE
Diagnosis: UNDETERMINED — AB
High risk HPV: NEGATIVE

## 2022-11-05 ENCOUNTER — Encounter: Payer: Self-pay | Admitting: Radiology

## 2022-11-05 ENCOUNTER — Ambulatory Visit
Admission: RE | Admit: 2022-11-05 | Discharge: 2022-11-05 | Disposition: A | Discharge status: Home / Self Care | Payer: Medicaid Other | Source: Ambulatory Visit | Attending: Family Medicine | Admitting: Family Medicine

## 2022-11-05 DIAGNOSIS — Z1231 Encounter for screening mammogram for malignant neoplasm of breast: Secondary | ICD-10-CM | POA: Diagnosis present

## 2023-01-01 DIAGNOSIS — E113513 Type 2 diabetes mellitus with proliferative diabetic retinopathy with macular edema, bilateral: Secondary | ICD-10-CM | POA: Diagnosis not present

## 2023-01-01 DIAGNOSIS — H43391 Other vitreous opacities, right eye: Secondary | ICD-10-CM | POA: Diagnosis not present

## 2023-01-01 DIAGNOSIS — T85398A Other mechanical complication of other ocular prosthetic devices, implants and grafts, initial encounter: Secondary | ICD-10-CM | POA: Diagnosis not present

## 2023-01-01 DIAGNOSIS — H35373 Puckering of macula, bilateral: Secondary | ICD-10-CM | POA: Diagnosis not present

## 2023-01-01 DIAGNOSIS — Z8669 Personal history of other diseases of the nervous system and sense organs: Secondary | ICD-10-CM | POA: Diagnosis not present

## 2023-01-28 ENCOUNTER — Encounter (HOSPITAL_COMMUNITY): Admission: RE | Payer: Self-pay | Source: Home / Self Care

## 2023-01-28 ENCOUNTER — Ambulatory Visit (HOSPITAL_COMMUNITY): Admission: RE | Admit: 2023-01-28 | Payer: Medicaid Other | Source: Home / Self Care | Admitting: Ophthalmology

## 2023-01-28 SURGERY — PARS PLANA VITRECTOMY WITH 25 GAUGE
Anesthesia: Monitor Anesthesia Care | Laterality: Right

## 2023-05-08 ENCOUNTER — Other Ambulatory Visit: Payer: Self-pay

## 2023-05-08 ENCOUNTER — Encounter (HOSPITAL_COMMUNITY): Payer: Self-pay | Admitting: Ophthalmology

## 2023-05-08 NOTE — Progress Notes (Signed)
 PCP - Center, [facility] [place]  Cardiologist -   PPM/ICD - denies Device Orders - n/a Rep Notified - n/a  Chest x-ray - denies EKG - denies Stress Test - denies ECHO - denies Cardiac Cath - denies  CPAP - denies  GLP-1 - No longer taking Ozempic  Fasting Blood Sugar - Per patient between 150-160 Checks Blood Sugar BID  Blood Thinner Instructions: denies Aspirin Instructions: n/a  ERAS Protcol - clear liquids until 1:00 P.M.  COVID TEST- no  Anesthesia review: yes  Patient verbally denies any shortness of breath, fever, cough and chest pain during phone call   -------------  SDW INSTRUCTIONS given:  Your procedure is scheduled on May 13, 2023.  Report to [facility] Main Entrance "A" at 1:30 P.M., and check in at the Admitting office.  Call this number if you have problems the morning of surgery:  [phone]   Remember:  Do not eat after midnight the night before your surgery  You may drink clear liquids until 1:00 the afternoon of your surgery.   Clear liquids allowed are: Water, Non-Citrus Juices (without pulp), Carbonated Beverages, Clear Tea, Black Coffee Only, and Gatorade    Take these medicines the morning of surgery with A SIP OF WATER  metoprolol (TOPROL-XL)   WHAT DO I DO ABOUT MY DIABETES MEDICATION?   Do not take oral diabetes medicinesmetFORMIN (GLUCOPHAGE-XR)   JARDIANCE 25  (Last dose 05-09-23) the morning of surgery.  THE MORNING OF SURGERY, take 25 (1/2 of 50) units of (LANTUS) insulin.  The day of surgery, do not take other diabetes injectables, including Byetta (exenatide), Bydureon (exenatide ER), Victoza (liraglutide), or Trulicity (dulaglutide).  If your CBG is greater than 220 mg/dL, you may take  of your sliding scale (correction) dose of insulin.   HOW TO MANAGE YOUR DIABETES BEFORE AND AFTER SURGERY  Why is it important to control my blood sugar before and after surgery? Improving blood sugar levels before and after  surgery helps healing and can limit problems. A way of improving blood sugar control is eating a healthy diet by:  Eating less sugar and carbohydrates  Increasing activity/exercise  Talking with your doctor about reaching your blood sugar goals High blood sugars (greater than 180 mg/dL) can raise your risk of infections and slow your recovery, so you will need to focus on controlling your diabetes during the weeks before surgery. Make sure that the doctor who takes care of your diabetes knows about your planned surgery including the date and location.  How do I manage my blood sugar before surgery? Check your blood sugar at least 4 times a day, starting 2 days before surgery, to make sure that the level is not too high or low.  Check your blood sugar the morning of your surgery when you wake up and every 2 hours until you get to the Short Stay unit.  If your blood sugar is less than 70 mg/dL, you will need to treat for low blood sugar: Do not take insulin. Treat a low blood sugar (less than 70 mg/dL) with  cup of clear juice (cranberry or apple), 4 glucose tablets, OR glucose gel. Recheck blood sugar in 15 minutes after treatment (to make sure it is greater than 70 mg/dL). If your blood sugar is not greater than 70 mg/dL on recheck, call [phone] for further instructions. Report your blood sugar to the short stay nurse when you get to Short Stay.  If you are admitted to the hospital  after surgery: Your blood sugar will be checked by the staff and you will probably be given insulin after surgery (instead of oral diabetes medicines) to make sure you have good blood sugar levels. The goal for blood sugar control after surgery is 80-180 mg/dL.  As of today, STOP taking any Aspirin (unless otherwise instructed by your surgeon) Aleve, Naproxen, Ibuprofen, Motrin, Advil, Goody's, BC's, all herbal medications, fish oil, and all vitamins.                      Do not wear jewelry, make up, or  nail polish            Do not wear lotions, powders, perfumes/colognes, or deodorant.            Do not shave 48 hours prior to surgery.  Men may shave face and neck.            Do not bring valuables to the hospital.            [facility] is not responsible for any belongings or valuables.  Do NOT Smoke (Tobacco/Vaping) 24 hours prior to your procedure If you use a CPAP at night, you may bring all equipment for your overnight stay.   Contacts, glasses, dentures or bridgework may not be worn into surgery.      For patients admitted to the hospital, discharge time will be determined by your treatment team.   Patients discharged the day of surgery will not be allowed to drive home, and someone needs to stay with them for 24 hours.    Special instructions:   [place]- Preparing For Surgery  Before surgery, you can play an important role. Because skin is not sterile, your skin needs to be as free of germs as possible. You can reduce the number of germs on your skin by washing with CHG (chlorahexidine gluconate) Soap before surgery.  CHG is an antiseptic cleaner which kills germs and bonds with the skin to continue killing germs even after washing.    Oral Hygiene is also important to reduce your risk of infection.  Remember - BRUSH YOUR TEETH THE MORNING OF SURGERY WITH YOUR REGULAR TOOTHPASTE  Please do not use if you have an allergy to CHG or antibacterial soaps. If your skin becomes reddened/irritated stop using the CHG.  Do not shave (including legs and underarms) for at least 48 hours prior to first CHG shower. It is OK to shave your face.  Please follow these instructions carefully.   Shower the NIGHT BEFORE SURGERY and the MORNING OF SURGERY with DIAL Soap.   Pat yourself dry with a CLEAN TOWEL.  Wear CLEAN PAJAMAS to bed the night before surgery  Place CLEAN SHEETS on your bed the night of your first shower and DO NOT SLEEP WITH PETS.   Day of Surgery: Please shower  morning of surgery  Wear Clean/Comfortable clothing the morning of surgery Do not apply any deodorants/lotions.   Remember to brush your teeth WITH YOUR REGULAR TOOTHPASTE.   Questions were answered. Patient verbalized understanding of instructions.

## 2023-05-13 ENCOUNTER — Ambulatory Visit (HOSPITAL_COMMUNITY)
Admission: RE | Admit: 2023-05-13 | Discharge: 2023-05-13 | Disposition: A | Discharge status: Home / Self Care | Attending: Ophthalmology | Admitting: Ophthalmology

## 2023-05-13 ENCOUNTER — Encounter (HOSPITAL_COMMUNITY)
Admission: RE | Disposition: A | Discharge status: Home / Self Care | Payer: Self-pay | Source: Home / Self Care | Attending: Ophthalmology

## 2023-05-13 ENCOUNTER — Encounter (HOSPITAL_COMMUNITY): Payer: Self-pay | Admitting: Ophthalmology

## 2023-05-13 ENCOUNTER — Ambulatory Visit (HOSPITAL_BASED_OUTPATIENT_CLINIC_OR_DEPARTMENT_OTHER): Payer: Self-pay | Admitting: Physician Assistant

## 2023-05-13 ENCOUNTER — Ambulatory Visit (HOSPITAL_COMMUNITY): Payer: Self-pay | Admitting: Physician Assistant

## 2023-05-13 DIAGNOSIS — G4730 Sleep apnea, unspecified: Secondary | ICD-10-CM | POA: Insufficient documentation

## 2023-05-13 DIAGNOSIS — H43391 Other vitreous opacities, right eye: Secondary | ICD-10-CM | POA: Diagnosis present

## 2023-05-13 DIAGNOSIS — F418 Other specified anxiety disorders: Secondary | ICD-10-CM | POA: Diagnosis not present

## 2023-05-13 DIAGNOSIS — H3341 Traction detachment of retina, right eye: Secondary | ICD-10-CM

## 2023-05-13 DIAGNOSIS — I10 Essential (primary) hypertension: Secondary | ICD-10-CM

## 2023-05-13 DIAGNOSIS — H4311 Vitreous hemorrhage, right eye: Secondary | ICD-10-CM

## 2023-05-13 DIAGNOSIS — E119 Type 2 diabetes mellitus without complications: Secondary | ICD-10-CM | POA: Insufficient documentation

## 2023-05-13 HISTORY — PX: PARS PLANA VITRECTOMY W/ ENDOLASER PANRETINAL PHOTOCOAGULATION: SHX7338

## 2023-05-13 HISTORY — DX: Depression, unspecified: F32.A

## 2023-05-13 HISTORY — PX: REPAIR OF COMPLEX TRACTION RETINAL DETACHMENT: SHX6217

## 2023-05-13 LAB — CBC
HCT: 42.5 % (ref 36.0–46.0)
Hemoglobin: 13.4 g/dL (ref 12.0–15.0)
MCH: 26.5 pg (ref 26.0–34.0)
MCHC: 31.5 g/dL (ref 30.0–36.0)
MCV: 84 fL (ref 80.0–100.0)
Platelets: 237 10*3/uL (ref 150–400)
RBC: 5.06 MIL/uL (ref 3.87–5.11)
RDW: 13.1 % (ref 11.5–15.5)
WBC: 7.8 10*3/uL (ref 4.0–10.5)
nRBC: 0 % (ref 0.0–0.2)

## 2023-05-13 LAB — GLUCOSE, CAPILLARY
Glucose-Capillary: 91 mg/dL (ref 70–99)
Glucose-Capillary: 93 mg/dL (ref 70–99)
Glucose-Capillary: 85 mg/dL (ref 70–99)
Glucose-Capillary: 82 mg/dL (ref 70–99)

## 2023-05-13 LAB — BASIC METABOLIC PANEL
Anion gap: 10 (ref 5–15)
BUN: 23 mg/dL — ABNORMAL HIGH (ref 6–20)
CO2: 26 mmol/L (ref 22–32)
Calcium: 9 mg/dL (ref 8.9–10.3)
Chloride: 105 mmol/L (ref 98–111)
Creatinine, Ser: 0.92 mg/dL (ref 0.44–1.00)
GFR, Estimated: >60 mL/min (ref >60)
Glucose, Bld: 98 mg/dL (ref 70–99)
Potassium: 3.8 mmol/L (ref 3.5–5.1)
Sodium: 141 mmol/L (ref 135–145)

## 2023-05-13 LAB — POCT PREGNANCY, URINE: Preg Test, Ur: NEGATIVE

## 2023-05-13 SURGERY — PARS PLANA VITRECTOMY WITH 25 GAUGE WITH ENDOLASER PANRETINAL PHOTOCOAGULATION
Anesthesia: Monitor Anesthesia Care | Laterality: Right

## 2023-05-13 MED ORDER — BSS IO SOLN
INTRAOCULAR | Status: AC
  Filled 2023-05-13: qty 15

## 2023-05-13 MED ORDER — HYALURONIDASE HUMAN 150 UNIT/ML IJ SOLN
INTRAMUSCULAR | Status: DC | PRN
  Administered 2023-05-13: 150 [IU] via SUBCUTANEOUS

## 2023-05-13 MED ORDER — PROPARACAINE HCL 0.5 % OP SOLN
OPHTHALMIC | Status: AC
  Administered 2023-05-13: 1 [drp] via OPHTHALMIC
  Filled 2023-05-13: qty 15

## 2023-05-13 MED ORDER — PHENYLEPHRINE HCL 10 % OP SOLN
OPHTHALMIC | Status: AC
  Administered 2023-05-13: 1 [drp] via OPHTHALMIC
  Filled 2023-05-13: qty 5

## 2023-05-13 MED ORDER — MIDAZOLAM HCL 2 MG/2ML IJ SOLN
INTRAMUSCULAR | Status: AC
  Filled 2023-05-13: qty 2

## 2023-05-13 MED ORDER — ACETAMINOPHEN 500 MG PO TABS
1000.0000 mg | ORAL_TABLET | Freq: Once | ORAL | Status: AC
  Administered 2023-05-13: 1000 mg via ORAL
  Filled 2023-05-13: qty 2

## 2023-05-13 MED ORDER — INSULIN ASPART 100 UNIT/ML IJ SOLN: 0.0000 [IU] | INTRAMUSCULAR | Status: DC | PRN

## 2023-05-13 MED ORDER — LIDOCAINE 2% (20 MG/ML) 5 ML SYRINGE
INTRAMUSCULAR | Status: AC
  Filled 2023-05-13: qty 5

## 2023-05-13 MED ORDER — CEFAZOLIN SUBCONJUNCTIVAL INJECTION 100 MG/0.5 ML
INJECTION | SUBCONJUNCTIVAL | Status: DC | PRN
  Administered 2023-05-13: 100 mg via SUBCONJUNCTIVAL

## 2023-05-13 MED ORDER — CYCLOPENTOLATE HCL 1 % OP SOLN
OPHTHALMIC | Status: AC
  Administered 2023-05-13: 1 [drp] via OPHTHALMIC
  Filled 2023-05-13: qty 2

## 2023-05-13 MED ORDER — DEXAMETHASONE SODIUM PHOSPHATE 10 MG/ML IJ SOLN
INTRAMUSCULAR | Status: DC | PRN
  Administered 2023-05-13: 10 mg via INTRAVENOUS

## 2023-05-13 MED ORDER — LIDOCAINE 2% (20 MG/ML) 5 ML SYRINGE
INTRAMUSCULAR | Status: DC | PRN
  Administered 2023-05-13: 60 mg via INTRAVENOUS

## 2023-05-13 MED ORDER — BSS PLUS IO SOLN
INTRAOCULAR | Status: DC | PRN
  Administered 2023-05-13: 1 via INTRAOCULAR

## 2023-05-13 MED ORDER — PROPARACAINE HCL 0.5 % OP SOLN
1.0000 [drp] | OPHTHALMIC | Status: AC | PRN
  Administered 2023-05-13 (×2): 1 [drp] via OPHTHALMIC

## 2023-05-13 MED ORDER — LIDOCAINE HCL 2 % IJ SOLN
INTRAMUSCULAR | Status: AC
  Filled 2023-05-13: qty 20

## 2023-05-13 MED ORDER — SODIUM HYALURONATE 10 MG/ML IO SOLUTION
PREFILLED_SYRINGE | INTRAOCULAR | Status: AC
  Filled 2023-05-13: qty 0.85

## 2023-05-13 MED ORDER — OFLOXACIN 0.3 % OP SOLN
OPHTHALMIC | Status: AC
Start: 2023-05-13 — End: 2023-05-13
  Administered 2023-05-13: 1 [drp] via OPHTHALMIC
  Filled 2023-05-13: qty 5

## 2023-05-13 MED ORDER — BUPIVACAINE HCL (PF) 0.75 % IJ SOLN
INTRAMUSCULAR | Status: AC
  Filled 2023-05-13: qty 10

## 2023-05-13 MED ORDER — ATROPINE SULFATE 1 % OP SOLN
OPHTHALMIC | Status: AC
  Filled 2023-05-13: qty 5

## 2023-05-13 MED ORDER — SODIUM CHLORIDE 0.9 % IV SOLN: INTRAVENOUS | Status: DC

## 2023-05-13 MED ORDER — CEFAZOLIN SUBCONJUNCTIVAL INJECTION 100 MG/0.5 ML
100.0000 mg | INJECTION | SUBCONJUNCTIVAL | Status: DC
Start: 2023-05-13 — End: 2023-05-14
  Filled 2023-05-13: qty 5

## 2023-05-13 MED ORDER — EPINEPHRINE PF 1 MG/ML IJ SOLN
INTRAMUSCULAR | Status: AC
  Filled 2023-05-13: qty 1

## 2023-05-13 MED ORDER — CHLORHEXIDINE GLUCONATE 0.12 % MT SOLN
15.0000 mL | OROMUCOSAL | Status: AC
  Administered 2023-05-13: 15 mL via OROMUCOSAL
  Filled 2023-05-13 (×2): qty 15

## 2023-05-13 MED ORDER — CYCLOPENTOLATE HCL 1 % OP SOLN
1.0000 [drp] | OPHTHALMIC | Status: AC | PRN
  Administered 2023-05-13 (×2): 1 [drp] via OPHTHALMIC

## 2023-05-13 MED ORDER — HYALURONIDASE HUMAN 150 UNIT/ML IJ SOLN
INTRAMUSCULAR | Status: AC
  Filled 2023-05-13: qty 1

## 2023-05-13 MED ORDER — PHENYLEPHRINE HCL 10 % OP SOLN
1.0000 [drp] | OPHTHALMIC | Status: AC | PRN
  Administered 2023-05-13 (×2): 1 [drp] via OPHTHALMIC

## 2023-05-13 MED ORDER — PHENYLEPHRINE 80 MCG/ML (10ML) SYRINGE FOR IV PUSH (FOR BLOOD PRESSURE SUPPORT)
PREFILLED_SYRINGE | INTRAVENOUS | Status: AC
  Filled 2023-05-13: qty 10

## 2023-05-13 MED ORDER — TOBRAMYCIN-DEXAMETHASONE 0.3-0.1 % OP OINT
TOPICAL_OINTMENT | OPHTHALMIC | Status: AC
  Filled 2023-05-13: qty 3.5

## 2023-05-13 MED ORDER — MIDAZOLAM HCL 2 MG/2ML IJ SOLN
INTRAMUSCULAR | Status: DC | PRN
  Administered 2023-05-13: 2 mg via INTRAVENOUS

## 2023-05-13 MED ORDER — LIDOCAINE HCL (PF) 2 % IJ SOLN
INTRAMUSCULAR | Status: DC | PRN
  Administered 2023-05-13: 7 mL via RETROBULBAR

## 2023-05-13 MED ORDER — DEXAMETHASONE SODIUM PHOSPHATE 10 MG/ML IJ SOLN
INTRAMUSCULAR | Status: AC
  Filled 2023-05-13: qty 1

## 2023-05-13 MED ORDER — TETRACAINE HCL 0.5 % OP SOLN
OPHTHALMIC | Status: AC
  Filled 2023-05-13: qty 4

## 2023-05-13 MED ORDER — PROPOFOL 10 MG/ML IV BOLUS
INTRAVENOUS | Status: DC | PRN
  Administered 2023-05-13: 60 mg via INTRAVENOUS
  Administered 2023-05-13: 50 ug/kg/min via INTRAVENOUS

## 2023-05-13 MED ORDER — BSS IO SOLN
INTRAOCULAR | Status: DC | PRN
  Administered 2023-05-13: 15 mL via INTRAOCULAR

## 2023-05-13 MED ORDER — OFLOXACIN 0.3 % OP SOLN
1.0000 [drp] | OPHTHALMIC | Status: AC | PRN
  Administered 2023-05-13 (×2): 1 [drp] via OPHTHALMIC

## 2023-05-13 MED ORDER — PHENYLEPHRINE 80 MCG/ML (10ML) SYRINGE FOR IV PUSH (FOR BLOOD PRESSURE SUPPORT)
PREFILLED_SYRINGE | INTRAVENOUS | Status: DC | PRN
  Administered 2023-05-13: 80 ug via INTRAVENOUS

## 2023-05-13 MED ORDER — BSS PLUS IO SOLN
INTRAOCULAR | Status: AC
  Filled 2023-05-13: qty 500

## 2023-05-13 SURGICAL SUPPLY — 58 items
APPLICATOR COTTON TIP 6 STRL (MISCELLANEOUS) ×1 IMPLANT
APPLICATOR COTTON TIP 6IN STRL (MISCELLANEOUS) ×1 IMPLANT
BAND WRIST GAS GREEN (MISCELLANEOUS) IMPLANT
BLADE MVR KNIFE 20G (BLADE) IMPLANT
BLADE STAB KNIFE 15DEG (BLADE) IMPLANT
CABLE BIPOLOR RESECTION CORD (MISCELLANEOUS) IMPLANT
CANNULA ANT CHAM MAIN (OPHTHALMIC RELATED) IMPLANT
CANNULA DUAL BORE 23G (CANNULA) IMPLANT
CANNULA DUALBORE 25G (CANNULA) IMPLANT
CANNULA VLV SOFT TIP 25G (OPHTHALMIC) ×1 IMPLANT
CANNULA VLV SOFT TIP 25GA (OPHTHALMIC) ×1 IMPLANT
CAUTERY EYE LOW TEMP 1300F FIN (OPHTHALMIC RELATED) IMPLANT
CLSR STERI-STRIP ANTIMIC 1/2X4 (GAUZE/BANDAGES/DRESSINGS) ×1 IMPLANT
COVER MAYO STAND STRL (DRAPES) IMPLANT
DRAPE HALF SHEET 40X57 (DRAPES) ×1 IMPLANT
DRAPE INCISE 51X51 W/FILM STRL (DRAPES) ×1 IMPLANT
DRAPE RETRACTOR (MISCELLANEOUS) ×1 IMPLANT
ERASER HMR WETFIELD 23G BP (MISCELLANEOUS) IMPLANT
FORCEPS ECKARDT ILM 25G SERR (OPHTHALMIC RELATED) IMPLANT
FORCEPS GRIESHABER ILM 25G A (INSTRUMENTS) IMPLANT
GAS AUTO FILL CONSTEL (OPHTHALMIC) IMPLANT
GAS AUTO FILL CONSTELLATION (OPHTHALMIC) IMPLANT
GLOVE SURG SYN 7.5 E (GLOVE) ×1 IMPLANT
GLOVE SURG SYN 7.5 PF PI (GLOVE) ×1 IMPLANT
GOWN STRL REUS W/ TWL LRG LVL3 (GOWN DISPOSABLE) ×2 IMPLANT
KIT BASIN OR (CUSTOM PROCEDURE TRAY) ×1 IMPLANT
KIT TURNOVER KIT B (KITS) ×1 IMPLANT
LENS BIOM SUPER VIEW SET DISP (MISCELLANEOUS) ×1 IMPLANT
MICROPICK 25G (MISCELLANEOUS) IMPLANT
NDL 18GX1X1/2 (RX/OR ONLY) (NEEDLE) ×1 IMPLANT
NDL 25GX 5/8IN NON SAFETY (NEEDLE) ×1 IMPLANT
NDL 27GX1/2 REG BEVEL ECLIP (NEEDLE) ×1 IMPLANT
NDL FILTER BLUNT 18X1 1/2 (NEEDLE) ×1 IMPLANT
NDL HYPO 30X.5 LL (NEEDLE) ×2 IMPLANT
NDL RETROBULBAR 25GX1.5 (NEEDLE) ×1 IMPLANT
NEEDLE 18GX1X1/2 (RX/OR ONLY) (NEEDLE) ×1 IMPLANT
NEEDLE 25GX 5/8IN NON SAFETY (NEEDLE) ×1 IMPLANT
NEEDLE 27GX1/2 REG BEVEL ECLIP (NEEDLE) ×1 IMPLANT
NEEDLE FILTER BLUNT 18X1 1/2 (NEEDLE) ×1 IMPLANT
NEEDLE HYPO 30X.5 LL (NEEDLE) ×2 IMPLANT
NEEDLE RETROBULBAR 25GX1.5 (NEEDLE) ×1 IMPLANT
NS IRRIG 1000ML POUR BTL (IV SOLUTION) ×1 IMPLANT
PACK FRAGMATOME (OPHTHALMIC) IMPLANT
PACK VITRECTOMY CUSTOM (CUSTOM PROCEDURE TRAY) ×1 IMPLANT
PAD ARMBOARD POSITIONER FOAM (MISCELLANEOUS) ×2 IMPLANT
PAK PIK VITRECTOMY CVS 25GA (OPHTHALMIC) ×1 IMPLANT
PICK MICROPICK 25G (MISCELLANEOUS) IMPLANT
PROBE ENDO DIATHERMY 25G (MISCELLANEOUS) ×1 IMPLANT
PROBE LASER ILLUM FLEX CVD 25G (OPHTHALMIC) IMPLANT
SCRAPER DIAMOND 25GA (OPHTHALMIC RELATED) IMPLANT
SET INJECTOR OIL FLUID CONSTEL (OPHTHALMIC) IMPLANT
SOL ANTI FOG 6CC (MISCELLANEOUS) ×1 IMPLANT
STOPCOCK 4 WAY LG BORE MALE ST (IV SETS) IMPLANT
SUT VICRYL 7 0 TG140 8 (SUTURE) ×1 IMPLANT
SUT VICRYL 8 0 TG140 8 (SUTURE) IMPLANT
SYR 10ML LL (SYRINGE) IMPLANT
SYR TB 1ML LUER SLIP (SYRINGE) ×2 IMPLANT
WATER STERILE IRR 1000ML POUR (IV SOLUTION) ×1 IMPLANT

## 2023-05-13 NOTE — Anesthesia Postprocedure Evaluation (Signed)
 Anesthesia Post Note  Patient: Kathryn Hogan  Procedure(s) Performed: PARS PLANA VITRECTOMY WITH 25 GAUGE WITH ENDOLASER PANRETINAL PHOTOCOAGULATION (Right) REPAIR, RETINAL DETACHMENT, COMPLEX (Right)     Patient location during evaluation: PACU Anesthesia Type: MAC Level of consciousness: awake and alert Pain management: pain level controlled Vital Signs Assessment: post-procedure vital signs reviewed and stable Respiratory status: spontaneous breathing, nonlabored ventilation and respiratory function stable Cardiovascular status: stable and blood pressure returned to baseline Postop Assessment: no apparent nausea or vomiting Anesthetic complications: no  No notable events documented.  Last Vitals:  Vitals:   05/13/23 1915 05/13/23 1930  BP: (!) 152/91 (!) 151/91  Pulse: 69 66  Resp: 13 14  Temp:  36.8 C  SpO2: 96% 95%    Last Pain:  Vitals:   05/13/23 1910  TempSrc:   PainSc: 0-No pain                 Abeeha Twist,W. EDMOND

## 2023-05-13 NOTE — H&P (Signed)
 Date of examination:  05/13/23  Indication for surgery: tractional retinal detachment right eye  Pertinent past medical history:  Past Medical History:  Diagnosis Date   Anxiety    Depression    Diabetes mellitus without complication (HCC)    Hypertension    Sleep apnea    does not use CPAP    Pertinent ocular history:  proliferative diabetic retinopathy right eye  Pertinent family history:  Family History  Problem Relation Age of Onset   Breast cancer Sister 20   Esophageal cancer Mother    Colon cancer Father    Bone cancer Sister     General:  Healthy appearing patient in no distress.         Eyes:    Acuity OD CF   External: Within normal limits      Anterior segment: Within normal limits        Fundus: TRD, gliosis      Impression: Tractional retinal detachment right eye  Plan:   Tractional retinal detachment repair right eye  Carmela Rima, MD

## 2023-05-13 NOTE — Transfer of Care (Signed)
 Immediate Anesthesia Transfer of Care Note  Patient: Kathryn Hogan  Procedure(s) Performed: PARS PLANA VITRECTOMY WITH 25 GAUGE WITH ENDOLASER PANRETINAL PHOTOCOAGULATION (Right) REPAIR, RETINAL DETACHMENT, COMPLEX (Right)  Patient Location: PACU  Anesthesia Type:MAC  Level of Consciousness: awake, alert , and oriented  Airway & Oxygen Therapy: Patient Spontanous Breathing  Post-op Assessment: Report given to RN and Post -op Vital signs reviewed and stable  Post vital signs: Reviewed and stable  Last Vitals:  Vitals Value Taken Time  BP 151/91 05/13/23 1930  Temp 36.8 C 05/13/23 1910  Pulse 66 05/13/23 1936  Resp 14 05/13/23 1936  SpO2 95 % 05/13/23 1936  Vitals shown include unfiled device data.  Last Pain:  Vitals:   05/13/23 1910  TempSrc:   PainSc: 0-No pain      Patients Stated Pain Goal: 0 (05/13/23 1335)  Complications: No notable events documented.

## 2023-05-13 NOTE — Brief Op Note (Signed)
 05/13/2023  7:02 PM  PATIENT:  Kathryn Hogan  53 y.o. female  PRE-OPERATIVE DIAGNOSIS:  RETINAL DETACHMENT, TRACTION, RIGHT VITREOUS OPACITIES OF RIGHT EYE  POST-OPERATIVE DIAGNOSIS:  RETINAL DETACHMENT, TRACTION, RIGHT VITREOUS HEMORRHAGE  PROCEDURE:  Procedure(s) with comments: PARS PLANA VITRECTOMY WITH 25 GAUGE WITH ENDOLASER PANRETINAL PHOTOCOAGULATION (Right) - MAC WITH BLOCK REPAIR, RETINAL DETACHMENT, COMPLEX (Right) WITH MEMBRANECTOMY (Right)  SURGEON:  Surgeons and Role:    * Carmela Rima, MD - Primary  PHYSICIAN ASSISTANT:   ASSISTANTS: none   ANESTHESIA:   local and MAC  EBL:  5 mL   BLOOD ADMINISTERED:none  DRAINS: none   LOCAL MEDICATIONS USED:  MARCAINE    and LIDOCAINE   SPECIMEN:  No Specimen  DISPOSITION OF SPECIMEN:  N/A  COUNTS:  YES  TOURNIQUET:  * No tourniquets in log *  DICTATION: .Note written in EPIC  PLAN OF CARE: Discharge to home after PACU  PATIENT DISPOSITION:  PACU - hemodynamically stable.   Delay start of Pharmacological VTE agent (>24hrs) due to surgical blood loss or risk of bleeding: not applicable

## 2023-05-13 NOTE — Anesthesia Preprocedure Evaluation (Signed)
 Anesthesia Evaluation  Patient identified by MRN, date of birth, ID band Patient awake    Reviewed: Allergy & Precautions, H&P , NPO status , Patient's Chart, lab work & pertinent test results  Airway Mallampati: II   Neck ROM: full    Dental   Pulmonary sleep apnea    breath sounds clear to auscultation       Cardiovascular hypertension,  Rhythm:regular Rate:Normal     Neuro/Psych  Headaches, Seizures -,  PSYCHIATRIC DISORDERS Anxiety Depression       GI/Hepatic   Endo/Other  diabetes, Type 2  Class 4 obesity  Renal/GU      Musculoskeletal   Abdominal   Peds  Hematology   Anesthesia Other Findings   Reproductive/Obstetrics                             Anesthesia Physical Anesthesia Plan  ASA: 3  Anesthesia Plan: MAC   Post-op Pain Management:    Induction: Intravenous  PONV Risk Score and Plan: 2 and Propofol infusion, Midazolam and Treatment may vary due to age or medical condition  Airway Management Planned: Nasal Cannula  Additional Equipment:   Intra-op Plan:   Post-operative Plan:   Informed Consent: I have reviewed the patients History and Physical, chart, labs and discussed the procedure including the risks, benefits and alternatives for the proposed anesthesia with the patient or authorized representative who has indicated his/her understanding and acceptance.     Dental advisory given  Plan Discussed with: CRNA, Anesthesiologist and Surgeon  Anesthesia Plan Comments:        Anesthesia Quick Evaluation

## 2023-05-13 NOTE — Discharge Instructions (Signed)
 DO NOT SLEEP ON BACK, THE EYE PRESSURE CAN GO UP AND CAUSE VISION LOSS.  SLEEP ON SIDE UNTIL AIR BUBBLE IS GONE  SLEEP ON SIDE WITH NOSE TO PILLOW  DURING DAY KEEP UPRIGHT

## 2023-05-13 NOTE — Anesthesia Procedure Notes (Signed)
 Procedure Name: MAC Date/Time: 05/13/2023 6:16 PM  Performed by: Laruth Bouchard., CRNAPre-anesthesia Checklist: Patient identified, Emergency Drugs available, Suction available, Patient being monitored and Timeout performed Patient Re-evaluated:Patient Re-evaluated prior to induction Oxygen Delivery Method: Nasal cannula Preoxygenation: Pre-oxygenation with 100% oxygen Induction Type: IV induction Placement Confirmation: positive ETCO2

## 2023-05-14 ENCOUNTER — Encounter (HOSPITAL_COMMUNITY): Payer: Self-pay | Admitting: Ophthalmology

## 2023-06-24 NOTE — Op Note (Signed)
 Kathryn Hogan 05/13/2023 Diagnosis:   Procedure: Pars [place]ana Vitrectomy, silicone oil removal, membranectomy, endolaser and placement of gas tamponade right eye Operative Eye:  right eye  Surgeon: Jacelyn Martinez Estimated Blood Loss: minimal Specimens for Pathology:  None Complications: none    After informed consent was obtained, the patient was brought to the operating room and a time-out confirmed the correct operative eye as the left eye. Retrobulbar anesthesia was obtained in the left eye without complication  The  patient was prepped and draped in the usual fashion for ocular surgery on the  right eye .  A lid speculum was placed.  Infusion line and trocar was placed at the 8 o'clock position approximately 3.5 mm from the surgical limbus.   The infusion line was allowed to run and then clamped when placed at the cannula opening. The line was inserted and secured to the drape with an adhesive strip.   Active trocars/cannula were placed at the 10 and 2 o'clock positions approximately 3.5 mm from the surgical limbus. The cannula was visualized in the vitreous cavity.  The viscous fluid extraction canula was placed in the eye and silicone oil removed from the eye.  The area of traction was carefully dissected and membranectomy was performed.  After membranectomy, subretinal fluid was drained and the retina flattened.  Endolaser was applied to the areas where traction was noted.  3 rows of endolaser were applied 360 degrees to the periphery.  A complete air-fluid exchange was then performed and additional endolaser was applied. 14% SF6 gas was injected into the eye. The trocars were sequentially removed and all were noted to be airtight. Subconjunctival injections of antibiotic and Decadron  were placed.   The speculum and drapes were removed and the eye was patched with Polymixin/Bacitracin ophthalmic ointment. An eye shield was placed and the patient was transferred alert and conversant  with stable vital signs to the post operative recovery area.  The patient tolerated the procedure well and no complications were noted.   Jacelyn Martinez MD

## 2023-07-08 ENCOUNTER — Encounter (HOSPITAL_COMMUNITY): Payer: Self-pay | Admitting: Ophthalmology

## 2023-07-08 ENCOUNTER — Ambulatory Visit (HOSPITAL_BASED_OUTPATIENT_CLINIC_OR_DEPARTMENT_OTHER): Admitting: Anesthesiology

## 2023-07-08 ENCOUNTER — Ambulatory Visit (HOSPITAL_COMMUNITY)
Admission: RE | Admit: 2023-07-08 | Discharge: 2023-07-09 | Disposition: A | Discharge status: Home / Self Care | Attending: Ophthalmology | Admitting: Ophthalmology

## 2023-07-08 ENCOUNTER — Ambulatory Visit (HOSPITAL_COMMUNITY): Admitting: Anesthesiology

## 2023-07-08 ENCOUNTER — Encounter (HOSPITAL_COMMUNITY)
Admission: RE | Disposition: A | Discharge status: Home / Self Care | Payer: Self-pay | Source: Home / Self Care | Attending: Ophthalmology

## 2023-07-08 DIAGNOSIS — H3341 Traction detachment of retina, right eye: Secondary | ICD-10-CM | POA: Insufficient documentation

## 2023-07-08 DIAGNOSIS — H4311 Vitreous hemorrhage, right eye: Secondary | ICD-10-CM | POA: Insufficient documentation

## 2023-07-08 DIAGNOSIS — H2101 Hyphema, right eye: Secondary | ICD-10-CM | POA: Insufficient documentation

## 2023-07-08 DIAGNOSIS — Z6841 Body Mass Index (BMI) 40.0 and over, adult: Secondary | ICD-10-CM | POA: Diagnosis not present

## 2023-07-08 DIAGNOSIS — F418 Other specified anxiety disorders: Secondary | ICD-10-CM | POA: Diagnosis not present

## 2023-07-08 DIAGNOSIS — G4730 Sleep apnea, unspecified: Secondary | ICD-10-CM | POA: Insufficient documentation

## 2023-07-08 DIAGNOSIS — I10 Essential (primary) hypertension: Secondary | ICD-10-CM | POA: Insufficient documentation

## 2023-07-08 DIAGNOSIS — E6689 Other obesity not elsewhere classified: Secondary | ICD-10-CM | POA: Diagnosis not present

## 2023-07-08 DIAGNOSIS — E119 Type 2 diabetes mellitus without complications: Secondary | ICD-10-CM | POA: Diagnosis not present

## 2023-07-08 DIAGNOSIS — Z01818 Encounter for other preprocedural examination: Secondary | ICD-10-CM

## 2023-07-08 HISTORY — PX: REPAIR OF COMPLEX TRACTION RETINAL DETACHMENT: SHX6217

## 2023-07-08 LAB — GLUCOSE, CAPILLARY
Glucose-Capillary: 98 mg/dL (ref 70–99)
Glucose-Capillary: 95 mg/dL (ref 70–99)

## 2023-07-08 LAB — CBC
HCT: 44.2 % (ref 36.0–46.0)
Hemoglobin: 13.9 g/dL (ref 12.0–15.0)
MCH: 26.6 pg (ref 26.0–34.0)
MCHC: 31.4 g/dL (ref 30.0–36.0)
MCV: 84.5 fL (ref 80.0–100.0)
Platelets: 264 10*3/uL (ref 150–400)
RBC: 5.23 MIL/uL — ABNORMAL HIGH (ref 3.87–5.11)
RDW: 13.2 % (ref 11.5–15.5)
WBC: 9.4 10*3/uL (ref 4.0–10.5)
nRBC: 0 % (ref 0.0–0.2)

## 2023-07-08 LAB — BASIC METABOLIC PANEL WITH GFR
Anion gap: 10 (ref 5–15)
BUN: 12 mg/dL (ref 6–20)
CO2: 25 mmol/L (ref 22–32)
Calcium: 8.9 mg/dL (ref 8.9–10.3)
Chloride: 105 mmol/L (ref 98–111)
Creatinine, Ser: 0.86 mg/dL (ref 0.44–1.00)
GFR, Estimated: >60 mL/min (ref >60)
Glucose, Bld: 94 mg/dL (ref 70–99)
Potassium: 3.9 mmol/L (ref 3.5–5.1)
Sodium: 140 mmol/L (ref 135–145)

## 2023-07-08 SURGERY — REPAIR, RETINAL DETACHMENT, COMPLEX
Anesthesia: Monitor Anesthesia Care | Site: Eye | Laterality: Right

## 2023-07-08 MED ORDER — SODIUM CHLORIDE 0.9 % IV SOLN: INTRAVENOUS | Status: DC | PRN

## 2023-07-08 MED ORDER — TOBRAMYCIN-DEXAMETHASONE 0.3-0.1 % OP OINT
TOPICAL_OINTMENT | OPHTHALMIC | Status: AC
  Filled 2023-07-08: qty 3.5

## 2023-07-08 MED ORDER — LIDOCAINE HCL (PF) 2 % IJ SOLN
INTRAMUSCULAR | Status: DC | PRN
  Administered 2023-07-08: 4.5 mL

## 2023-07-08 MED ORDER — ATROPINE SULFATE 1 % OP SOLN
OPHTHALMIC | Status: AC
Start: 2023-07-08 — End: ?
  Filled 2023-07-08: qty 5

## 2023-07-08 MED ORDER — INSULIN ASPART 100 UNIT/ML IJ SOLN: 0.0000 [IU] | INTRAMUSCULAR | Status: DC | PRN

## 2023-07-08 MED ORDER — CHLORHEXIDINE GLUCONATE 0.12 % MT SOLN: 15.0000 mL | Freq: Once | OROMUCOSAL | Status: DC

## 2023-07-08 MED ORDER — TETRACAINE HCL 0.5 % OP SOLN
OPHTHALMIC | Status: AC
  Filled 2023-07-08: qty 4

## 2023-07-08 MED ORDER — FENTANYL CITRATE (PF) 250 MCG/5ML IJ SOLN
INTRAMUSCULAR | Status: AC
  Filled 2023-07-08: qty 5

## 2023-07-08 MED ORDER — CYCLOPENTOLATE HCL 1 % OP SOLN: 1.0000 [drp] | OPHTHALMIC | Status: DC | PRN

## 2023-07-08 MED ORDER — PROPARACAINE HCL 0.5 % OP SOLN: 1.0000 [drp] | OPHTHALMIC | Status: DC | PRN

## 2023-07-08 MED ORDER — SODIUM HYALURONATE 10 MG/ML IO SOLUTION
PREFILLED_SYRINGE | INTRAOCULAR | Status: AC
  Filled 2023-07-08: qty 0.85

## 2023-07-08 MED ORDER — BUPIVACAINE HCL (PF) 0.75 % IJ SOLN
INTRAMUSCULAR | Status: AC
Start: 2023-07-08 — End: ?
  Filled 2023-07-08: qty 10

## 2023-07-08 MED ORDER — 0.9 % SODIUM CHLORIDE (POUR BTL) OPTIME
TOPICAL | Status: DC | PRN
  Administered 2023-07-08: 100 mL

## 2023-07-08 MED ORDER — ORAL CARE MOUTH RINSE: 15.0000 mL | Freq: Once | OROMUCOSAL | Status: DC

## 2023-07-08 MED ORDER — EPINEPHRINE PF 1 MG/ML IJ SOLN
INTRAOCULAR | Status: DC | PRN
  Administered 2023-07-08: 500 mL

## 2023-07-08 MED ORDER — BUPIVACAINE HCL (PF) 0.75 % IJ SOLN
INTRAMUSCULAR | Status: DC | PRN
Start: 2023-07-08 — End: 2023-07-09
  Administered 2023-07-08: 4.5 mL

## 2023-07-08 MED ORDER — FENTANYL CITRATE (PF) 250 MCG/5ML IJ SOLN
INTRAMUSCULAR | Status: DC | PRN
Start: 2023-07-08 — End: 2023-07-09
  Administered 2023-07-08: 50 ug via INTRAVENOUS

## 2023-07-08 MED ORDER — LACTATED RINGERS IV SOLN
INTRAVENOUS | Status: DC
Start: 2023-07-08 — End: 2023-07-09

## 2023-07-08 MED ORDER — HYALURONIDASE HUMAN 150 UNIT/ML IJ SOLN
INTRAMUSCULAR | Status: DC | PRN
  Administered 2023-07-08: 150 [IU] via SUBCUTANEOUS

## 2023-07-08 MED ORDER — MIDAZOLAM HCL 2 MG/2ML IJ SOLN
INTRAMUSCULAR | Status: DC | PRN
  Administered 2023-07-08 (×2): 1 mg via INTRAVENOUS

## 2023-07-08 MED ORDER — OFLOXACIN 0.3 % OP SOLN: 1.0000 [drp] | OPHTHALMIC | Status: DC | PRN

## 2023-07-08 MED ORDER — EPINEPHRINE PF 1 MG/ML IJ SOLN
INTRAMUSCULAR | Status: AC
  Filled 2023-07-08: qty 1

## 2023-07-08 MED ORDER — PROPARACAINE HCL 0.5 % OP SOLN
OPHTHALMIC | Status: AC
  Filled 2023-07-08: qty 15

## 2023-07-08 MED ORDER — PHENYLEPHRINE HCL 10 % OP SOLN
1.0000 [drp] | OPHTHALMIC | Status: DC | PRN
  Filled 2023-07-08: qty 5

## 2023-07-08 MED ORDER — LIDOCAINE HCL 2 % IJ SOLN
INTRAMUSCULAR | Status: AC
  Filled 2023-07-08: qty 20

## 2023-07-08 MED ORDER — OFLOXACIN 0.3 % OP SOLN
OPHTHALMIC | Status: DC
Start: 2023-07-08 — End: 2023-07-09
  Filled 2023-07-08: qty 5

## 2023-07-08 MED ORDER — CYCLOPENTOLATE HCL 1 % OP SOLN
OPHTHALMIC | Status: DC
Start: 2023-07-08 — End: 2023-07-09
  Filled 2023-07-08: qty 2

## 2023-07-08 MED ORDER — STERILE WATER FOR IRRIGATION IR SOLN
Status: DC | PRN
  Administered 2023-07-08: 1

## 2023-07-08 MED ORDER — MIDAZOLAM HCL 2 MG/2ML IJ SOLN
INTRAMUSCULAR | Status: AC
  Filled 2023-07-08: qty 2

## 2023-07-08 MED ORDER — BSS PLUS IO SOLN
INTRAOCULAR | Status: AC
  Filled 2023-07-08: qty 500

## 2023-07-08 MED ORDER — DEXAMETHASONE SODIUM PHOSPHATE 10 MG/ML IJ SOLN
INTRAMUSCULAR | Status: AC
  Filled 2023-07-08: qty 1

## 2023-07-08 MED ORDER — BSS IO SOLN
INTRAOCULAR | Status: AC
  Filled 2023-07-08: qty 15

## 2023-07-08 MED ORDER — HYALURONIDASE HUMAN 150 UNIT/ML IJ SOLN
INTRAMUSCULAR | Status: AC
  Filled 2023-07-08: qty 1

## 2023-07-08 SURGICAL SUPPLY — 23 items
APPLICATOR COTTON TIP 6 STRL (MISCELLANEOUS) ×1 IMPLANT
APPLICATOR COTTON TIP 6IN STRL (MISCELLANEOUS) ×2 IMPLANT
CABLE BIPOLOR RESECTION CORD (MISCELLANEOUS) IMPLANT
CLSR STERI-STRIP ANTIMIC 1/2X4 (GAUZE/BANDAGES/DRESSINGS) ×1 IMPLANT
DRAPE HALF SHEET 40X57 (DRAPES) IMPLANT
GAS AUTO FILL CONSTELLATION (OPHTHALMIC) IMPLANT
GLOVE SURG SYN 7.5 E (GLOVE) ×1 IMPLANT
GLOVE SURG SYN 7.5 PF PI (GLOVE) ×1 IMPLANT
KIT BASIN OR (CUSTOM PROCEDURE TRAY) ×1 IMPLANT
KIT TURNOVER KIT B (KITS) ×1 IMPLANT
LENS BIOM SUPER VIEW SET DISP (MISCELLANEOUS) ×1 IMPLANT
NDL FILTER BLUNT 18X1 1/2 (NEEDLE) ×1 IMPLANT
NDL HYPO 30X.5 LL (NEEDLE) ×2 IMPLANT
NDL RETROBULBAR 25GX1.5 (NEEDLE) ×1 IMPLANT
NEEDLE FILTER BLUNT 18X1 1/2 (NEEDLE) ×1 IMPLANT
NEEDLE HYPO 30X.5 LL (NEEDLE) ×1 IMPLANT
NEEDLE RETROBULBAR 25GX1.5 (NEEDLE) ×1 IMPLANT
PACK VITRECTOMY CUSTOM (CUSTOM PROCEDURE TRAY) ×1 IMPLANT
PAK PIK VITRECTOMY CVS 25GA (OPHTHALMIC) ×1 IMPLANT
PROBE ENDO DIATHERMY 25G (MISCELLANEOUS) ×1 IMPLANT
SET INJECTOR OIL FLUID CONSTEL (OPHTHALMIC) IMPLANT
STOPCOCK 4 WAY LG BORE MALE ST (IV SETS) IMPLANT
SYR TB 1ML LUER SLIP (SYRINGE) ×2 IMPLANT

## 2023-07-08 NOTE — Anesthesia Preprocedure Evaluation (Signed)
 Anesthesia Evaluation  Patient identified by MRN, date of birth, ID band Patient awake    Reviewed: Allergy & Precautions, NPO status , Patient's Chart, lab work & pertinent test results, reviewed documented beta blocker date and time   History of Anesthesia Complications Negative for: history of anesthetic complications  Airway Mallampati: II  TM Distance: >3 FB     Dental  (+) Edentulous Upper, Edentulous Lower   Pulmonary sleep apnea and Continuous Positive Airway Pressure Ventilation , neg COPD   breath sounds clear to auscultation       Cardiovascular hypertension, (-) CAD, (-) Past MI and (-) Cardiac Stents  Rhythm:Regular Rate:Normal     Neuro/Psych  Headaches, Seizures -, Well Controlled,  PSYCHIATRIC DISORDERS Anxiety Depression       GI/Hepatic ,neg GERD  ,,(+) neg Cirrhosis        Endo/Other  diabetes  Class 4 obesity  Renal/GU Renal disease     Musculoskeletal   Abdominal   Peds  Hematology   Anesthesia Other Findings   Reproductive/Obstetrics                              Anesthesia Physical Anesthesia Plan  ASA: 3  Anesthesia Plan: MAC   Post-op Pain Management:    Induction: Intravenous  PONV Risk Score and Plan: 2 and Ondansetron   Airway Management Planned: Natural Airway and Nasal Cannula  Additional Equipment:   Intra-op Plan:   Post-operative Plan:   Informed Consent: I have reviewed the patients History and Physical, chart, labs and discussed the procedure including the risks, benefits and alternatives for the proposed anesthesia with the patient or authorized representative who has indicated his/her understanding and acceptance.       Plan Discussed with: CRNA  Anesthesia Plan Comments:          Anesthesia Quick Evaluation

## 2023-07-08 NOTE — Anesthesia Procedure Notes (Signed)
 Procedure Name: MAC Date/Time: 07/08/2023 11:15 PM  Performed by: Cosette Prindle C., CRNAPre-anesthesia Checklist: Patient identified, Emergency Drugs available, Suction available, Patient being monitored and Timeout performed Patient Re-evaluated:Patient Re-evaluated prior to induction Oxygen Delivery Method: Simple face mask Preoxygenation: Pre-oxygenation with 100% oxygen Placement Confirmation: positive ETCO2

## 2023-07-08 NOTE — Progress Notes (Addendum)
 1910 Pt arrived, pre-procedure care started. Dr Lydia Sams notified of pt's arrival. @1945  no pre-op orders yet. Transferred pt to PACU. Report was given to Michael in PACU.

## 2023-07-08 NOTE — H&P (Signed)
 Date of examination:  07/08/23  Indication for surgery: Hyphema, sudden vision loss and right eye pain with retinal detachment  Pertinent past medical history:  Past Medical History:  Diagnosis Date   Anxiety    Depression    Diabetes mellitus without complication (HCC)    Hypertension    Sleep apnea    does not use CPAP    Pertinent ocular history:   proliferative diabetic retinopathy  Pertinent family history:  Family History  Problem Relation Age of Onset   Breast cancer Sister 34   Esophageal cancer Mother    Colon cancer Father    Bone cancer Sister     General:  Healthy appearing patient in no distress.     Eyes:    Acuity OD: HM   External: Within normal limits      Anterior segment: hyphema and NVI OD         Fundus: No view - due to [facility]    Impression: Hyphema, sudden vision loss and right eye pain with retinal detachment  Plan:   Retinal detachment repair and anterior chamber washout right eye  Jearline Minder, MD

## 2023-07-09 ENCOUNTER — Encounter (HOSPITAL_COMMUNITY): Payer: Self-pay | Admitting: Ophthalmology

## 2023-07-09 DIAGNOSIS — H3341 Traction detachment of retina, right eye: Secondary | ICD-10-CM | POA: Diagnosis not present

## 2023-07-09 LAB — GLUCOSE, CAPILLARY: Glucose-Capillary: 101 mg/dL — ABNORMAL HIGH (ref 70–99)

## 2023-07-09 MED ORDER — FENTANYL CITRATE (PF) 100 MCG/2ML IJ SOLN: 25.0000 ug | INTRAMUSCULAR | Status: DC | PRN

## 2023-07-09 MED ORDER — BSS IO SOLN
INTRAOCULAR | Status: DC | PRN
  Administered 2023-07-09: 30 mL via INTRAOCULAR

## 2023-07-09 MED ORDER — ONDANSETRON HCL 4 MG/2ML IJ SOLN
INTRAMUSCULAR | Status: DC | PRN
  Administered 2023-07-09: 4 mg via INTRAVENOUS

## 2023-07-09 MED ORDER — OXYCODONE HCL 5 MG/5ML PO SOLN: 5.0000 mg | Freq: Once | ORAL | Status: DC | PRN

## 2023-07-09 MED ORDER — ACETAMINOPHEN 10 MG/ML IV SOLN: 1000.0000 mg | Freq: Once | INTRAVENOUS | Status: DC | PRN

## 2023-07-09 MED ORDER — OXYCODONE HCL 5 MG PO TABS: 5.0000 mg | ORAL_TABLET | Freq: Once | ORAL | Status: DC | PRN

## 2023-07-09 MED ORDER — DEXAMETHASONE SODIUM PHOSPHATE 10 MG/ML IJ SOLN
INTRAMUSCULAR | Status: DC | PRN
Start: 2023-07-09 — End: 2023-07-09
  Administered 2023-07-09: 10 mg

## 2023-07-09 MED ORDER — ONDANSETRON HCL 4 MG/2ML IJ SOLN: 4.0000 mg | Freq: Once | INTRAMUSCULAR | Status: DC | PRN

## 2023-07-09 MED ORDER — SODIUM HYALURONATE 10 MG/ML IO SOLUTION
PREFILLED_SYRINGE | INTRAOCULAR | Status: DC | PRN
Start: 2023-07-09 — End: 2023-07-09
  Administered 2023-07-09: .55 mL via INTRAOCULAR

## 2023-07-09 MED ORDER — TOBRAMYCIN-DEXAMETHASONE 0.3-0.1 % OP OINT
TOPICAL_OINTMENT | OPHTHALMIC | Status: DC | PRN
  Administered 2023-07-09: 1 via OPHTHALMIC

## 2023-07-09 NOTE — Brief Op Note (Signed)
 07/08/2023 - 07/09/2023  12:25 AM  PATIENT:  Kathryn Hogan  53 y.o. female  PRE-OPERATIVE DIAGNOSIS:  Traction detachment of retina and hyphema/vitreous hemorrhage right eye  POST-OPERATIVE DIAGNOSIS:  Traction detachment of right retinaTraction detachment of retina and hyphema/vitreous hemorrhage right eye  PROCEDURE:  Procedure(s) with comments: Vitrectomy and anterior chamber washout right eye  SURGEON:  Surgeons and Role:    * Jearline Minder, MD - Primary  PHYSICIAN ASSISTANT:   ASSISTANTS: none   ANESTHESIA:   local and MAC  EBL:  1cc  BLOOD ADMINISTERED:none  DRAINS: none   LOCAL MEDICATIONS USED:  MARCAINE     and LIDOCAINE    SPECIMEN:  No Specimen  DISPOSITION OF SPECIMEN:  N/A  COUNTS:  YES  TOURNIQUET:  * No tourniquets in log *  DICTATION: .Note written in EPIC  PLAN OF CARE: Discharge to home after PACU  PATIENT DISPOSITION:  PACU - hemodynamically stable.   Delay start of Pharmacological VTE agent (>24hrs) due to surgical blood loss or risk of bleeding: not applicable

## 2023-07-09 NOTE — Transfer of Care (Signed)
 Immediate Anesthesia Transfer of Care Note  Patient: Kathryn Hogan  Procedure(s) Performed: ATTEMPTED REPAIR, RETINAL DETACHMENT, COMPLEX (Right: Eye)  Patient Location: PACU  Anesthesia Type:MAC  Level of Consciousness: awake, alert , and oriented  Airway & Oxygen Therapy: Patient Spontanous Breathing  Post-op Assessment: Report given to RN and Post -op Vital signs reviewed and stable  Post vital signs: Reviewed and stable  Last Vitals:  Vitals Value Taken Time  BP 158/90 07/09/23 0033  Temp    Pulse 81 07/09/23 0037  Resp 11 07/09/23 0037  SpO2 96 % 07/09/23 0037  Vitals shown include unfiled device data.  Last Pain:  Vitals:   07/08/23 1944  TempSrc: Oral  PainSc: 4          Complications: No notable events documented.

## 2023-07-09 NOTE — Discharge Instructions (Addendum)
 DO NOT SLEEP ON BACK,    SLEEP ON SIDE WITH NOSE TO PILLOW - SLEEP WITH HEAD ELEVATED  DURING DAY KEEP UPRIGHT

## 2023-07-09 NOTE — Anesthesia Postprocedure Evaluation (Signed)
 Anesthesia Post Note  Patient: Kathryn Hogan  Procedure(s) Performed: ATTEMPTED REPAIR, RETINAL DETACHMENT, COMPLEX;  Vitrectomy and anterior chamber washout right eye (Right: Eye)     Patient location during evaluation: PACU Anesthesia Type: MAC Level of consciousness: awake and alert Pain management: pain level controlled Vital Signs Assessment: post-procedure vital signs reviewed and stable Respiratory status: spontaneous breathing, nonlabored ventilation, respiratory function stable and patient connected to nasal cannula oxygen Cardiovascular status: stable and blood pressure returned to baseline Postop Assessment: no apparent nausea or vomiting Anesthetic complications: no   No notable events documented.  Last Vitals:  Vitals:   07/09/23 0035 07/09/23 0045  BP: (!) 158/90 132/78  Pulse: 82 83  Resp: 13 13  Temp: 36.7 C 36.7 C  SpO2: 100% 94%    Last Pain:  Vitals:   07/09/23 0035  TempSrc:   PainSc: 0-No pain                 Leslye Rast

## 2023-08-05 NOTE — Op Note (Signed)
 Azura Tufaro Rea 07/09/2023 Diagnosis: Tractional retinal detachment, vitreous hemorrhage, and hyphema right eye  Procedure: Vitrectomy with anterior chamber washout right eye Operative Eye:  right eye  Surgeon: Jacelyn Martinez Estimated Blood Loss: minimal Specimens for Pathology:  None Complications: none   The  patient was prepped and draped in the usual fashion for ocular surgery on the  right eye .  A lid speculum was placed.  In order to attain a view of the posterior pole, anterior chamber washout was performed.  Viscoelastic was placed in the anterior chamber to keep the view of the posterior pole clear.  AniInfusion line and trocar was placed at the 8 o'clock position approximately 3.5 mm from the surgical limbus.   The infusion line was allowed to run and then clamped when placed at the cannula opening. The line was inserted and secured to the drape with an adhesive strip.   Active trocars/cannula were placed at the 10 and 2 o'clock positions approximately 3.5 mm from the surgical limbus. The cannula was not visualized in the vitreous cavity due to the vitreous hemorrhage.   The light pipe and vitreous cutter were inserted into the vitreous cavity and a core vitrectomy was performed.  Clot was removed from the posterior chamber.  After removal of the clot and clearing of the vitreous hemorrhage, the retina was noted be be detached and thickened.  Given the degree of gliosis, retinectomy and silicone oil tamponade would not allow the retina to be flattened.  The anterior chamber was washed out to clear viscoelastic.    A partial air-fluid exchange was performed.  The superior cannulas were sequentially removed with concommitant tamponade using a cotton tipped applicator and noted to be air tight.  The infusion line and trocar were removed and the sclerotomy was noted to be air tight with normal intraocular pressure by digital palpapation.  Subconjunctival injections of antibiotic and  Dexamethasone  4mg /25ml were placed in the infero-medial quadrant.   The speculum and drapes were removed and the eye was patched with Polymixin/Bacitracin ophthalmic ointment. An eye shield was placed and the patient was transferred alert and conversant with stable vital signs to the post operative recovery area.  The patient tolerated the procedure well and no complications were noted.  Jacelyn Martinez MD

## 2024-01-16 ENCOUNTER — Other Ambulatory Visit: Payer: Self-pay

## 2024-01-16 ENCOUNTER — Emergency Department

## 2024-01-16 ENCOUNTER — Inpatient Hospital Stay
Admission: EM | Admit: 2024-01-16 | Discharge: 2024-01-19 | DRG: 304 | Disposition: A | Discharge status: Home / Self Care | Attending: Internal Medicine | Admitting: Internal Medicine

## 2024-01-16 DIAGNOSIS — Z7985 Long-term (current) use of injectable non-insulin antidiabetic drugs: Secondary | ICD-10-CM

## 2024-01-16 DIAGNOSIS — Z1152 Encounter for screening for COVID-19: Secondary | ICD-10-CM

## 2024-01-16 DIAGNOSIS — E1165 Type 2 diabetes mellitus with hyperglycemia: Principal | ICD-10-CM

## 2024-01-16 DIAGNOSIS — G4733 Obstructive sleep apnea (adult) (pediatric): Secondary | ICD-10-CM | POA: Diagnosis present

## 2024-01-16 DIAGNOSIS — I5031 Acute diastolic (congestive) heart failure: Secondary | ICD-10-CM | POA: Diagnosis present

## 2024-01-16 DIAGNOSIS — Z800 Family history of malignant neoplasm of digestive organs: Secondary | ICD-10-CM

## 2024-01-16 DIAGNOSIS — J810 Acute pulmonary edema: Principal | ICD-10-CM | POA: Diagnosis present

## 2024-01-16 DIAGNOSIS — J9601 Acute respiratory failure with hypoxia: Secondary | ICD-10-CM | POA: Diagnosis present

## 2024-01-16 DIAGNOSIS — Z803 Family history of malignant neoplasm of breast: Secondary | ICD-10-CM

## 2024-01-16 DIAGNOSIS — Z6841 Body Mass Index (BMI) 40.0 and over, adult: Secondary | ICD-10-CM

## 2024-01-16 DIAGNOSIS — Z79899 Other long term (current) drug therapy: Secondary | ICD-10-CM

## 2024-01-16 DIAGNOSIS — I10 Essential (primary) hypertension: Secondary | ICD-10-CM | POA: Diagnosis present

## 2024-01-16 DIAGNOSIS — I119 Hypertensive heart disease without heart failure: Principal | ICD-10-CM | POA: Diagnosis present

## 2024-01-16 DIAGNOSIS — Z7984 Long term (current) use of oral hypoglycemic drugs: Secondary | ICD-10-CM

## 2024-01-16 DIAGNOSIS — F32A Depression, unspecified: Secondary | ICD-10-CM | POA: Diagnosis present

## 2024-01-16 DIAGNOSIS — E119 Type 2 diabetes mellitus without complications: Secondary | ICD-10-CM | POA: Diagnosis present

## 2024-01-16 DIAGNOSIS — Z794 Long term (current) use of insulin: Secondary | ICD-10-CM

## 2024-01-16 LAB — CBC
HCT: 43.8 % (ref 36.0–46.0)
Hemoglobin: 13.3 g/dL (ref 12.0–15.0)
MCH: 26.1 pg (ref 26.0–34.0)
MCHC: 30.4 g/dL (ref 30.0–36.0)
MCV: 85.9 fL (ref 80.0–100.0)
Platelets: 263 K/uL (ref 150–400)
RBC: 5.1 MIL/uL (ref 3.87–5.11)
RDW: 13.4 % (ref 11.5–15.5)
WBC: 11.2 K/uL — ABNORMAL HIGH (ref 4.0–10.5)
nRBC: 0 % (ref 0.0–0.2)

## 2024-01-16 LAB — COMPREHENSIVE METABOLIC PANEL WITH GFR
ALT: 14 U/L (ref 0–44)
AST: 12 U/L — ABNORMAL LOW (ref 15–41)
Albumin: 3.8 g/dL (ref 3.5–5.0)
Alkaline Phosphatase: 120 U/L (ref 38–126)
Anion gap: 11 (ref 5–15)
BUN: 21 mg/dL — ABNORMAL HIGH (ref 6–20)
CO2: 26 mmol/L (ref 22–32)
Calcium: 9 mg/dL (ref 8.9–10.3)
Chloride: 106 mmol/L (ref 98–111)
Creatinine, Ser: 1.04 mg/dL — ABNORMAL HIGH (ref 0.44–1.00)
GFR, Estimated: >60 mL/min (ref >60)
Glucose, Bld: 193 mg/dL — ABNORMAL HIGH (ref 70–99)
Potassium: 4.3 mmol/L (ref 3.5–5.1)
Sodium: 143 mmol/L (ref 135–145)
Total Bilirubin: 0.5 mg/dL (ref 0.0–1.2)
Total Protein: 7.3 g/dL (ref 6.5–8.1)

## 2024-01-16 LAB — PRO BRAIN NATRIURETIC PEPTIDE: Pro Brain Natriuretic Peptide: 271 pg/mL (ref <300.0)

## 2024-01-16 LAB — RESP PANEL BY RT-PCR (RSV, FLU A&B, COVID)  RVPGX2
Influenza A by PCR: NEGATIVE
Influenza B by PCR: NEGATIVE
Resp Syncytial Virus by PCR: NEGATIVE
SARS Coronavirus 2 by RT PCR: NEGATIVE

## 2024-01-16 LAB — GLUCOSE, CAPILLARY
Glucose-Capillary: 118 mg/dL — ABNORMAL HIGH (ref 70–99)
Glucose-Capillary: 134 mg/dL — ABNORMAL HIGH (ref 70–99)

## 2024-01-16 LAB — TROPONIN T, HIGH SENSITIVITY: Troponin T High Sensitivity: <15 ng/L (ref 0–19)

## 2024-01-16 LAB — LACTIC ACID, PLASMA: Lactic Acid, Venous: 1.3 mmol/L (ref 0.5–1.9)

## 2024-01-16 MED ORDER — ONDANSETRON HCL 4 MG/2ML IJ SOLN: 4.0000 mg | Freq: Four times a day (QID) | INTRAMUSCULAR | Status: DC | PRN

## 2024-01-16 MED ORDER — INSULIN GLARGINE-YFGN 100 UNIT/ML ~~LOC~~ SOLN
30.0000 [IU] | Freq: Every day | SUBCUTANEOUS | Status: DC
  Administered 2024-01-16 – 2024-01-18 (×3): 30 [IU] via SUBCUTANEOUS
  Filled 2024-01-16 (×4): qty 0.3

## 2024-01-16 MED ORDER — BUMETANIDE 0.25 MG/ML IJ SOLN
1.0000 mg | Freq: Two times a day (BID) | INTRAMUSCULAR | Status: DC
  Administered 2024-01-16 – 2024-01-19 (×6): 1 mg via INTRAVENOUS
  Filled 2024-01-16 (×6): qty 4

## 2024-01-16 MED ORDER — SENNOSIDES-DOCUSATE SODIUM 8.6-50 MG PO TABS: 1.0000 | ORAL_TABLET | Freq: Every evening | ORAL | Status: DC | PRN

## 2024-01-16 MED ORDER — EMPAGLIFLOZIN 25 MG PO TABS
25.0000 mg | ORAL_TABLET | Freq: Every evening | ORAL | Status: DC
  Administered 2024-01-16 – 2024-01-18 (×3): 25 mg via ORAL
  Filled 2024-01-16 (×4): qty 1

## 2024-01-16 MED ORDER — METFORMIN HCL ER 500 MG PO TB24
500.0000 mg | ORAL_TABLET | Freq: Two times a day (BID) | ORAL | Status: DC
  Administered 2024-01-16 – 2024-01-19 (×6): 500 mg via ORAL
  Filled 2024-01-16 (×7): qty 1

## 2024-01-16 MED ORDER — ONDANSETRON HCL 4 MG PO TABS: 4.0000 mg | ORAL_TABLET | Freq: Four times a day (QID) | ORAL | Status: DC | PRN

## 2024-01-16 MED ORDER — ENOXAPARIN SODIUM 80 MG/0.8ML IJ SOSY
70.0000 mg | PREFILLED_SYRINGE | INTRAMUSCULAR | Status: DC
  Administered 2024-01-16 – 2024-01-18 (×3): 70 mg via SUBCUTANEOUS
  Filled 2024-01-16 (×4): qty 0.7

## 2024-01-16 MED ORDER — ENALAPRILAT 1.25 MG/ML IV SOLN
0.6250 mg | Freq: Once | INTRAVENOUS | Status: AC
  Administered 2024-01-16: 0.625 mg via INTRAVENOUS

## 2024-01-16 MED ORDER — METOPROLOL SUCCINATE ER 100 MG PO TB24
200.0000 mg | ORAL_TABLET | Freq: Every evening | ORAL | Status: DC
  Administered 2024-01-16: 200 mg via ORAL
  Filled 2024-01-16: qty 2

## 2024-01-16 MED ORDER — INSULIN ASPART 100 UNIT/ML IJ SOLN: 0.0000 [IU] | Freq: Every day | INTRAMUSCULAR | Status: DC

## 2024-01-16 MED ORDER — ACETAMINOPHEN 325 MG PO TABS
650.0000 mg | ORAL_TABLET | Freq: Four times a day (QID) | ORAL | Status: DC | PRN
  Administered 2024-01-16 – 2024-01-18 (×4): 650 mg via ORAL
  Filled 2024-01-16 (×4): qty 2

## 2024-01-16 MED ORDER — ACETAMINOPHEN 650 MG RE SUPP: 650.0000 mg | Freq: Four times a day (QID) | RECTAL | Status: DC | PRN

## 2024-01-16 MED ORDER — AMLODIPINE BESYLATE 10 MG PO TABS
10.0000 mg | ORAL_TABLET | Freq: Every evening | ORAL | Status: DC
  Administered 2024-01-16 – 2024-01-18 (×3): 10 mg via ORAL
  Filled 2024-01-16 (×3): qty 1

## 2024-01-16 MED ORDER — FUROSEMIDE 10 MG/ML IJ SOLN
40.0000 mg | Freq: Once | INTRAMUSCULAR | Status: AC
  Administered 2024-01-16: 40 mg via INTRAVENOUS
  Filled 2024-01-16: qty 4

## 2024-01-16 MED ORDER — SERTRALINE HCL 50 MG PO TABS
100.0000 mg | ORAL_TABLET | Freq: Every evening | ORAL | Status: DC
  Administered 2024-01-16 – 2024-01-18 (×3): 100 mg via ORAL
  Filled 2024-01-16 (×3): qty 2

## 2024-01-16 MED ORDER — INSULIN ASPART 100 UNIT/ML IJ SOLN
0.0000 [IU] | Freq: Three times a day (TID) | INTRAMUSCULAR | Status: DC
  Administered 2024-01-17: 2 [IU] via SUBCUTANEOUS
  Administered 2024-01-18: 3 [IU] via SUBCUTANEOUS
  Administered 2024-01-18: 2 [IU] via SUBCUTANEOUS
  Filled 2024-01-16 (×2): qty 2
  Filled 2024-01-16: qty 3

## 2024-01-16 MED ORDER — BUMETANIDE 0.25 MG/ML IJ SOLN: 1.0000 mg | Freq: Every day | INTRAMUSCULAR | Status: DC

## 2024-01-16 MED ORDER — BENAZEPRIL HCL 20 MG PO TABS
40.0000 mg | ORAL_TABLET | Freq: Every evening | ORAL | Status: DC
  Administered 2024-01-16 – 2024-01-18 (×3): 40 mg via ORAL
  Filled 2024-01-16 (×4): qty 2

## 2024-01-16 NOTE — ED Provider Notes (Signed)
 [facility] Provider Note    Event Date/Time   First MD Initiated Contact with Patient 01/16/24 1239     (approximate)   History   Shortness of Breath   HPI  Kathryn Hogan is a 53 y.o. female with history of diabetes, hypertension, obesity, who presents with shortness of breath.  Patient reports increasing shortness of breath over the last week, worsened significantly over the last several days.  Found to be hypoxic in triage with room air saturation of 77%.  She denies chest pain.  No fevers.     Physical Exam   Triage Vital Signs: ED Triage Vitals  Encounter Vitals Group     BP 01/16/24 1229 (!) 167/86     Girls Systolic BP Percentile --      Girls Diastolic BP Percentile --      Boys Systolic BP Percentile --      Boys Diastolic BP Percentile --      Pulse Rate 01/16/24 1229 95     Resp 01/16/24 1229 (!) 26     Temp --      Temp src --      SpO2 01/16/24 1228 (!) 77 %     Weight 01/16/24 1231 (!) 138.3 kg (305 lb)     Height 01/16/24 1231 1.549 m (5' 1)     Head Circumference --      Peak Flow --      Pain Score 01/16/24 1230 0     Pain Loc --      Pain Education --      Exclude from Growth Chart --     Most recent vital signs: Vitals:   01/16/24 1229 01/16/24 1243  BP: (!) 167/86   Pulse: 95   Resp: (!) 26   SpO2: 95% 95%     General: Awake, no distress.  CV:  Good peripheral perfusion.  Tachycardia Resp:  Normal effort.  Tachypnea, bilateral rails Abd:  No distention.  Other:  Mild edema bilateral lower extremities   ED Results / Procedures / Treatments   Labs (all labs ordered are listed, but only abnormal results are displayed) Labs Reviewed  CBC - Abnormal; Notable for the following components:      Result Value   WBC 11.2 (*)    All other components within normal limits  CULTURE, BLOOD (ROUTINE X 2)  CULTURE, BLOOD (ROUTINE X 2)  RESP PANEL BY RT-PCR (RSV, FLU A&B, COVID)  RVPGX2  LACTIC ACID, PLASMA   COMPREHENSIVE METABOLIC PANEL WITH GFR  PRO BRAIN NATRIURETIC PEPTIDE  TROPONIN T, HIGH SENSITIVITY     EKG  ED ECG REPORT I, Lamar Price, the attending physician, personally viewed and interpreted this ECG.  Date: 01/16/2024  Rhythm: normal sinus rhythm QRS Axis: normal Intervals: normal ST/T Wave abnormalities: normal Narrative Interpretation: no evidence of acute ischemia    RADIOLOGY Chest x-ray view interpret by me, appears consistent with pulmonary edema    PROCEDURES:  Critical Care performed: yes  CRITICAL CARE Performed by: Lamar Price   Total critical care time: 30 minutes  Critical care time was exclusive of separately billable procedures and treating other patients.  Critical care was necessary to treat or prevent imminent or life-threatening deterioration.  Critical care was time spent personally by me on the following activities: development of treatment plan with patient and/or surrogate as well as nursing, discussions with consultants, evaluation of patient's response to treatment, examination of patient, obtaining history from patient  or surrogate, ordering and performing treatments and interventions, ordering and review of laboratory studies, ordering and review of radiographic studies, pulse oximetry and re-evaluation of patient's condition.   Procedures   MEDICATIONS ORDERED IN ED: Medications  furosemide (LASIX) injection 40 mg (has no administration in time range)  enalaprilat (VASOTEC) injection 0.625 mg (has no administration in time range)     IMPRESSION / MDM / ASSESSMENT AND PLAN / ED COURSE  I reviewed the triage vital signs and the nursing notes. Patient's presentation is most consistent with acute presentation with potential threat to life or bodily function.  Patient presents with shortness of breath as detailed above, hypoxic upon arrival on room air.  She is on 6 L nasal cannula with oxygen saturations approximately 96%  now.  Does not wear oxygen at home.  Differential includes severe pneumonia, pulmonary edema, less likely PE  Will obtain labs, chest x-ray placed in the cardiac monitor  Lab work demonstrates mild elevated white blood cell count of 11.2  Chest x-ray appears consistent with either multifocal pneumonia versus edema, likely edema  Normal lactic acid, presentation is most consistent with new onset CHF with pulmonary edema.  Will treat with IV Lasix, low-dose IV enalaprilat  Patient will require admission to the hospital      FINAL CLINICAL IMPRESSION(S) / ED DIAGNOSES   Final diagnoses:  Acute pulmonary edema (HCC)     Rx / DC Orders   ED Discharge Orders     None        Note:  This document was prepared using Dragon voice recognition software and may include unintentional dictation errors.   Arlander Charleston, MD 01/16/24 [phone]

## 2024-01-16 NOTE — Progress Notes (Signed)
 PHARMACIST - PHYSICIAN COMMUNICATION  CONCERNING:  Enoxaparin  (Lovenox ) for DVT Prophylaxis    RECOMMENDATION: Patient was prescribed enoxaprin 40mg  q24 hours for VTE prophylaxis.   Filed Weights   01/16/24 1231  Weight: (!) 138.3 kg (305 lb)    Body mass index is 57.63 kg/m.  Estimated Creatinine Clearance: 81 mL/min (A) (by C-G formula based on SCr of 1.04 mg/dL (H)).   Based on [facility] policy patient is candidate for enoxaparin  0.5mg /kg TBW SQ every 24 hours based on BMI being >30.  DESCRIPTION: Pharmacy has adjusted enoxaparin  dose per [facility] policy.  Patient is now receiving enoxaparin  70 mg every 24 hours    Kayla JULIANNA Blew, PharmD Clinical Pharmacist  01/16/2024 4:58 PM

## 2024-01-16 NOTE — ED Triage Notes (Signed)
 Pt to ED for SOB since 1 week. 77% on RA, placed on 6L. Pt has labored respirations. Denies hx asthma or other lung disease. 81% on 6L, moving to room.

## 2024-01-16 NOTE — Plan of Care (Signed)
   Problem: Nutritional: Goal: Maintenance of adequate nutrition will improve Outcome: Progressing   Problem: Education: Goal: Knowledge of General Education information will improve Description: Including pain rating scale, medication(s)/side effects and non-pharmacologic comfort measures Outcome: Progressing

## 2024-01-16 NOTE — H&P (Addendum)
 History and Physical    Patient: Kathryn Hogan FMW:[id number] DOB: 17-Apr-1970 DOA: 01/16/2024 DOS: the patient was seen and examined on 01/16/2024 PCP: [facility]  Patient coming from: Home  Chief Complaint:  Chief Complaint  Patient presents with   Shortness of Breath   HPI: Kathryn Hogan is a 53 y.o. female with medical history significant of hypertension, insulin -dependent type 2 diabetes presents to the emergency department for evaluation of worsening shortness of breath for the past few days.  She denies associated cough, wheezing, orthopnea, chest pain, palpitations, fevers, or change in her chronic lower extremity swelling. She has no known cardiac or pulmonary history and does not smoke. She does report running out of her water  pill last week.   On EMS arrival, she was reportedly saturating 77% on room air and was placed on 6 L nasal cannula.  In the emergency department, she was afebrile, nontachycardic, hypertensive, saturating appropriately on 6 L nasal cannula.  White blood cell count 11.2. Troponin negative, BNP 271.  COVID flu and RSV negative.  2 view chest x-ray suggestive of bilateral pulmonary edema with a small left pleural effusion.  She was given a dose of Lasix and hospitalist was called for admission for further management.   Review of Systems: Review of Systems  Constitutional:  Negative for chills, fever, malaise/fatigue and weight loss.  Eyes:  Negative for double vision and discharge.  Respiratory:  Positive for shortness of breath. Negative for cough, hemoptysis, sputum production and wheezing.   Cardiovascular:  Negative for chest pain, palpitations, orthopnea, claudication, leg swelling (chronic) and PND.  Gastrointestinal:  Positive for constipation. Negative for abdominal pain, diarrhea, nausea and vomiting.  Genitourinary:  Negative for dysuria and frequency.  Musculoskeletal:  Negative for myalgias.  Skin:  Negative for rash.   Neurological:  Negative for dizziness, tingling, tremors, sensory change, speech change, focal weakness, weakness and headaches.    Past Medical History:  Diagnosis Date   Anxiety    Depression    Diabetes mellitus without complication (HCC)    Hypertension    Sleep apnea    does not use CPAP   Past Surgical History:  Procedure Laterality Date   COLONOSCOPY WITH PROPOFOL  N/A 01/24/2022   Procedure: COLONOSCOPY WITH PROPOFOL ;  Surgeon: Therisa Bi, MD;  Location: [facility] ENDOSCOPY;  Service: Gastroenterology;  Laterality: N/A;   EYE SURGERY     GAS/FLUID EXCHANGE Left 07/25/2017   Procedure: GAS/FLUID EXCHANGE;  Surgeon: Tobie Baptist, MD;  Location: [facility] OR;  Service: Ophthalmology;  Laterality: Left;  SF6   HERNIA REPAIR     PARS PLANA VITRECTOMY Left 07/25/2017   Procedure: PARS PLANA VITRECTOMY WITH 25 GAUGE LEFT EYE;  Surgeon: Tobie Baptist, MD;  Location: [facility] OR;  Service: Ophthalmology;  Laterality: Left;   PARS PLANA VITRECTOMY Right 08/15/2017   Procedure: PARS PLANA VITRECTOMY WITH 25 GAUGE MEMBRANE PILL WITH AIR GAS SILICONE OIL AND PHOTOCOAGULATION  RIGHT;  Surgeon: Tobie Baptist, MD;  Location: [facility] OR;  Service: Ophthalmology;  Laterality: Right;   PARS PLANA VITRECTOMY W/ ENDOLASER PANRETINAL PHOTOCOAGULATION Right 05/13/2023   Procedure: PARS PLANA VITRECTOMY WITH 25 GAUGE WITH ENDOLASER PANRETINAL PHOTOCOAGULATION;  Surgeon: Tobie Baptist, MD;  Location: [facility] OR;  Service: Ophthalmology;  Laterality: Right;  MAC WITH BLOCK   PHOTOCOAGULATION WITH LASER Left 07/25/2017   Procedure: PHOTOCOAGULATION WITH LASER;  Surgeon: Tobie Baptist, MD;  Location: [facility] OR;  Service: Ophthalmology;  Laterality: Left;   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   Procedure: REPAIR, RETINAL DETACHMENT, COMPLEX;  Surgeon: Tobie Baptist, MD;  Location: [facility] OR;  Service: Ophthalmology;  Laterality: Right;   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 She has never used smokeless tobacco. She reports current alcohol use. She reports that she does not currently use drugs.  No Known Allergies  Family History  Problem Relation Age of Onset   Breast cancer Sister 11   Esophageal cancer Mother    Colon cancer Father    Bone cancer Sister     Prior to Admission medications   Medication Sig Start Date End Date Taking? Authorizing Provider  amLODipine  (NORVASC ) 10 MG tablet Take 10 mg by mouth every evening. 11/22/22   [provider]  benazepril  (LOTENSIN ) 40 MG tablet Take 1 tablet (40 mg total) by mouth daily. Patient taking differently: Take 40 mg by mouth every evening. 09/16/19   Geiple, Joshua, PA-C  Dulaglutide (TRULICITY) 0.75 MG/0.5ML SOAJ Inject 0.75 mg into the skin once a week.    [provider]  insulin  glargine (LANTUS ) 100 unit/mL SOPN Inject 50 Units into the skin in the morning.    [provider]  Insulin  Pen Needle 31G X 5 MM MISC 1 Dose by Does not apply route 3 (three) times daily before meals. 11/07/19   Josette Ade, MD  JARDIANCE  25 MG TABS tablet Take 25 mg by mouth every evening. 12/27/21   [provider]  metFORMIN  (GLUCOPHAGE -XR) 500 MG 24 hr tablet Take 500 mg by mouth in the morning and at bedtime.    [provider]  metoprolol  (TOPROL -XL) 200 MG 24 hr tablet Take 200 mg by mouth every evening. Take with or immediately following a meal.    [provider]  naproxen (NAPROSYN) 500 MG tablet Take 500  mg by mouth 2 (two) times daily as needed (pain.).    [provider]  sertraline  (ZOLOFT ) 100 MG tablet Take 100 mg by mouth every evening.    [provider]  torsemide  (DEMADEX ) 20 MG tablet Take 20 mg by mouth 3 (three) times daily.    [provider]    Physical Exam: Vitals:   01/16/24 1229 01/16/24 1231 01/16/24 1243 01/16/24 1435  BP: (!) 167/86   126/60  Pulse: 95   87  Resp: (!) 26   18  Temp:    98.6 F (37 C)  TempSrc:    Oral  SpO2: 95%  95% 97%  Weight:  (!) 138.3 kg    Height:  5' 1 (1.549 m)     Physical Exam Vitals and nursing note reviewed.  Constitutional:      General: She is not in acute distress.    Appearance: She is obese. She is not ill-appearing or toxic-appearing.  HENT:     Head: Normocephalic and atraumatic.  Eyes:     Extraocular Movements: Extraocular movements intact.     Pupils: Pupils are equal, round, and reactive to light.  Cardiovascular:     Rate and Rhythm:  Normal rate and regular rhythm.  Pulmonary:     Effort: Pulmonary effort is normal. No tachypnea or accessory muscle usage.     Comments: Diminished breath sounds b/l  Abdominal:     Palpations: Abdomen is soft.  Musculoskeletal:     Cervical back: Normal range of motion.     Right lower leg: Edema present.     Left lower leg: Edema present.  Skin:    General: Skin is warm and dry.  Neurological:     General: No focal deficit present.     Mental Status: She is alert and oriented to person, place, and time.     Cranial Nerves: No cranial nerve deficit.  Psychiatric:        Mood and Affect: Mood normal.        Behavior: Behavior normal.     Data Reviewed:   Labs on Admission: I have personally reviewed following labs and imaging studies  CBC: Recent Labs  Lab 01/16/24 1313  WBC 11.2*  HGB 13.3  HCT 43.8  MCV 85.9  PLT 263   Basic Metabolic Panel: Recent Labs  Lab 01/16/24 1340  NA 143  K 4.3  CL 106  CO2 26  GLUCOSE 193*  BUN  21*  CREATININE 1.04*  CALCIUM 9.0   GFR: Estimated Creatinine Clearance: 81 mL/min (A) (by C-G formula based on SCr of 1.04 mg/dL (H)). Liver Function Tests: Recent Labs  Lab 01/16/24 1340  AST 12*  ALT 14  ALKPHOS 120  BILITOT 0.5  PROT 7.3  ALBUMIN 3.8   No results for input(s): LIPASE, AMYLASE in the last 168 hours. No results for input(s): AMMONIA in the last 168 hours. Coagulation Profile: No results for input(s): INR, PROTIME in the last 168 hours. Cardiac Enzymes: No results for input(s): CKTOTAL, CKMB, CKMBINDEX, TROPONINI in the last 168 hours. BNP (last 3 results) Recent Labs    01/16/24 1340  PROBNP 271.0   HbA1C: No results for input(s): HGBA1C in the last 72 hours. CBG: No results for input(s): GLUCAP in the last 168 hours. Lipid Profile: No results for input(s): CHOL, HDL, LDLCALC, TRIG, CHOLHDL, LDLDIRECT in the last 72 hours. Thyroid  Function Tests: No results for input(s): TSH, T4TOTAL, FREET4, T3FREE, THYROIDAB in the last 72 hours. Anemia Panel: No results for input(s): VITAMINB12, FOLATE, FERRITIN, TIBC, IRON, RETICCTPCT in the last 72 hours. Urine analysis:    Component Value Date/Time   COLORURINE YELLOW (A) 11/06/2019 1014   APPEARANCEUR CLOUDY (A) 11/06/2019 1014   APPEARANCEUR Hazy 05/17/2014 1545   LABSPEC 1.023 11/06/2019 1014   LABSPEC 1.024 05/17/2014 1545   PHURINE 5.0 11/06/2019 1014   GLUCOSEU >=500 (A) 11/06/2019 1014   GLUCOSEU >=500 05/17/2014 1545   HGBUR LARGE (A) 11/06/2019 1014   BILIRUBINUR NEGATIVE 11/06/2019 1014   BILIRUBINUR Negative 05/17/2014 1545   KETONESUR NEGATIVE 11/06/2019 1014   PROTEINUR >=300 (A) 11/06/2019 1014   NITRITE NEGATIVE 11/06/2019 1014   LEUKOCYTESUR NEGATIVE 11/06/2019 1014   LEUKOCYTESUR Trace 05/17/2014 1545    Radiological Exams on Admission: DG Chest 2 View Result Date: 01/16/2024 CLINICAL DATA:  Short of breath for 1 week.  EXAM: CHEST - 2 VIEW COMPARISON:  11/06/2019. FINDINGS: Cardiac silhouette normal in size. No convincing mediastinal or hilar masses or adenopathy. Lungs demonstrate vascular congestion, interstitial prominence with patchy airspace opacities most evident in the bilateral upper lobes. Left lung base opacities consistent with a small effusion. No convincing right pleural effusion. No pneumothorax. Skeletal structures are grossly  intact. IMPRESSION: 1. Bilateral airspace lung opacities with overall findings suggesting pulmonary edema. Small left pleural effusion. Electronically Signed   By: Alm Parkins M.D.   On: 01/16/2024 13:45       Assessment and Plan:  53 y.o. female with medical history significant of hypertension, insulin -dependent type 2 diabetes with acute hypoxic respiratory failure due to pulmonary edema, admits to being out of her diuretic for some time. Admit for diuresis and weaning of supplemental oxygen   Acute hypoxic respiratory failure Pulmonary edema  - has been out of torsemide for the past week, no known cardiac or pulmonary history. - Requiring 6 LN with no baseline requirement  - IV bumex 1mg  BID, wean O2 as tolerated - Follow-up echo  HTN  - home medications resumed   T2DM  - cont jardiance, metformin , lantus  (30 units for now, takes 50u at home)  - SSI, diabetic diet   Lovenox  No IVF Monitor/replace electrolytes  Consistent carb diet    Advance Care Planning:   Code Status: Full Code discussed with the patient at time of admission     Severity of Illness: The appropriate patient status for this patient is OBSERVATION. Observation status is judged to be reasonable and necessary in order to provide the required intensity of service to ensure the patient's safety. The patient's presenting symptoms, physical exam findings, and initial radiographic and laboratory data in the context of their medical condition is felt to place them at decreased risk for further  clinical deterioration. Furthermore, it is anticipated that the patient will be medically stable for discharge from the hospital within 2 midnights of admission.     Author: Daved JAYSON Pump, DO 01/16/2024 4:56 PM  For on call review [email or web address].

## 2024-01-17 ENCOUNTER — Observation Stay
Admit: 2024-01-17 | Discharge: 2024-01-17 | Disposition: A | Discharge status: Home / Self Care | Attending: Emergency Medicine | Admitting: Emergency Medicine

## 2024-01-17 DIAGNOSIS — J9601 Acute respiratory failure with hypoxia: Secondary | ICD-10-CM

## 2024-01-17 DIAGNOSIS — R0609 Other forms of dyspnea: Secondary | ICD-10-CM | POA: Diagnosis not present

## 2024-01-17 LAB — CBC
HCT: 34.5 % — ABNORMAL LOW (ref 36.0–46.0)
Hemoglobin: 10.7 g/dL — ABNORMAL LOW (ref 12.0–15.0)
MCH: 26.4 pg (ref 26.0–34.0)
MCHC: 31 g/dL (ref 30.0–36.0)
MCV: 85.2 fL (ref 80.0–100.0)
Platelets: 246 K/uL (ref 150–400)
RBC: 4.05 MIL/uL (ref 3.87–5.11)
RDW: 13.4 % (ref 11.5–15.5)
WBC: 8 K/uL (ref 4.0–10.5)
nRBC: 0 % (ref 0.0–0.2)

## 2024-01-17 LAB — ECHOCARDIOGRAM COMPLETE
AR max vel: 2.47 cm2
AV Peak grad: 8.3 mmHg
Ao pk vel: 1.44 m/s
Area-P 1/2: 3.89 cm2
Calc EF: 65.2 %
Height: 61 in
S' Lateral: 3.1 cm
Single Plane A2C EF: 63.5 %
Single Plane A4C EF: 67.2 %
Weight: 4880 [oz_av]

## 2024-01-17 LAB — BASIC METABOLIC PANEL WITH GFR
Anion gap: 8 (ref 5–15)
BUN: 25 mg/dL — ABNORMAL HIGH (ref 6–20)
CO2: 28 mmol/L (ref 22–32)
Calcium: 8.6 mg/dL — ABNORMAL LOW (ref 8.9–10.3)
Chloride: 108 mmol/L (ref 98–111)
Creatinine, Ser: 0.99 mg/dL (ref 0.44–1.00)
GFR, Estimated: >60 mL/min (ref >60)
Glucose, Bld: 150 mg/dL — ABNORMAL HIGH (ref 70–99)
Potassium: 4.3 mmol/L (ref 3.5–5.1)
Sodium: 144 mmol/L (ref 135–145)

## 2024-01-17 LAB — GLUCOSE, CAPILLARY
Glucose-Capillary: 108 mg/dL — ABNORMAL HIGH (ref 70–99)
Glucose-Capillary: 142 mg/dL — ABNORMAL HIGH (ref 70–99)
Glucose-Capillary: 116 mg/dL — ABNORMAL HIGH (ref 70–99)
Glucose-Capillary: 149 mg/dL — ABNORMAL HIGH (ref 70–99)

## 2024-01-17 LAB — HIV ANTIBODY (ROUTINE TESTING W REFLEX): HIV Screen 4th Generation wRfx: NONREACTIVE

## 2024-01-17 LAB — MAGNESIUM: Magnesium: 2.4 mg/dL (ref 1.7–2.4)

## 2024-01-17 MED ORDER — PERFLUTREN LIPID MICROSPHERE
1.0000 mL | INTRAVENOUS | Status: AC | PRN
  Administered 2024-01-17: 1.5 mL via INTRAVENOUS

## 2024-01-17 NOTE — Progress Notes (Signed)
 Echocardiogram 2D Echocardiogram has been performed with echo contrast (definity 1.39mL).  Deania Siguenza N Britton Perkinson,RDCS 01/17/2024, 10:56 AM

## 2024-01-17 NOTE — Progress Notes (Signed)
 Progress Note   Patient: Kathryn Hogan FMW:[id number] DOB: Sep 09, 1970 DOA: 01/16/2024     1 DOS: the patient was seen and examined on 01/17/2024   Brief hospital course:  Kathryn Hogan is a 53 y.o. female with medical history significant of hypertension, insulin -dependent type 2 diabetes presents to the emergency department for evaluation of worsening shortness of breath for the past few days.  She denies associated cough, wheezing, orthopnea, chest pain, palpitations, fevers, or change in her chronic lower extremity swelling. She has no known cardiac or pulmonary history and does not smoke. She does report running out of her water  pill last week.     On EMS arrival, she was reportedly saturating 77% on room air and was placed on 6 L nasal cannula.  In the emergency department, she was afebrile, nontachycardic, hypertensive, saturating appropriately on 6 L nasal cannula.  White blood cell count 11.2. Troponin negative, BNP 271.  COVID flu and RSV negative.  2 view chest x-ray suggestive of bilateral pulmonary edema with a small left pleural effusion.  She was given a dose of Lasix and hospitalist was called for admission for further management.       Assessment and Plan:   Acute hypoxic respiratory failure Acute pulmonary edema  Hypertensive heart disease Per patient no documented history of congestive heart failure Initially required 6 L of oxygen to maintain pulse oximetry greater than 92%, currently down to 4 L Continue Bumex 1 mg twice daily Continue benazepril , metoprolol  and amlodipine  Follow-up results of 2D echocardiogram to assess LVEF      HTN  Uncontrolled Continue benazepril , metoprolol  and amlodipine  Uptitrate to optimize blood pressure control   T2DM  Hold metformin  Continue Lantus  and sliding scale insulin  Maintain consistent carbohydrate diet    Morbid obesity BMI 57.63 kg/m2 Complicates overall prognosis and plan Lifestyle modification and  exercise has been discussed with patient in detail    Depression Continue Zoloft      Subjective: Less short of breath this morning  Physical Exam: Vitals:   01/16/24 2103 01/17/24 0351 01/17/24 0751 01/17/24 0924  BP: 130/74 (!) 154/83 (!) 151/71   Pulse: 84 77 72   Resp: 20 18 18    Temp: 98.6 F (37 C) 98.6 F (37 C) 98.8 F (37.1 C)   TempSrc: Oral Oral Oral   SpO2: 96% 95% 97% 94%  Weight:      Height:       Vitals and nursing note reviewed.  Constitutional:      General: She is not in acute distress.    Appearance: She is obese. She is not ill-appearing or toxic-appearing.  HENT:     Head: Normocephalic and atraumatic.  Eyes:     Extraocular Movements: Extraocular movements intact.     Pupils: Pupils are equal, round, and reactive to light.  Cardiovascular:     Rate and Rhythm: Normal rate and regular rhythm.  Pulmonary:     Effort: Pulmonary effort is normal. No tachypnea or accessory muscle usage.     Comments: Diminished breath sounds b/l  Abdominal:     Palpations: Abdomen is soft.  Musculoskeletal:     Cervical back: Normal range of motion.     Right lower leg: Edema present.     Left lower leg: Edema present.  Skin:    General: Skin is warm and dry.  Neurological:     General: No focal deficit present.     Mental Status: She is alert and oriented to  person, place, and time.     Cranial Nerves: No cranial nerve deficit.  Psychiatric:        Mood and Affect: Mood normal.        Behavior: Behavior normal.       Data Reviewed: BUN 25, creatinine 0.9, bicarb 28, Labs reviewed  Family Communication: None  Disposition: Status is: Observation The patient remains OBS appropriate and will d/c before 2 midnights.  Planned Discharge Destination: Home    Time spent: 50 minutes  Author: Aimee Somerset, MD 01/17/2024 1:06 PM  For on call review [email or web address].

## 2024-01-18 DIAGNOSIS — J9601 Acute respiratory failure with hypoxia: Secondary | ICD-10-CM | POA: Diagnosis not present

## 2024-01-18 LAB — BASIC METABOLIC PANEL WITH GFR
Anion gap: 8 (ref 5–15)
BUN: 28 mg/dL — ABNORMAL HIGH (ref 6–20)
CO2: 29 mmol/L (ref 22–32)
Calcium: 8.7 mg/dL — ABNORMAL LOW (ref 8.9–10.3)
Chloride: 108 mmol/L (ref 98–111)
Creatinine, Ser: 0.98 mg/dL (ref 0.44–1.00)
GFR, Estimated: >60 mL/min (ref >60)
Glucose, Bld: 109 mg/dL — ABNORMAL HIGH (ref 70–99)
Potassium: 4.3 mmol/L (ref 3.5–5.1)
Sodium: 144 mmol/L (ref 135–145)

## 2024-01-18 LAB — GLUCOSE, CAPILLARY
Glucose-Capillary: 88 mg/dL (ref 70–99)
Glucose-Capillary: 121 mg/dL — ABNORMAL HIGH (ref 70–99)
Glucose-Capillary: 159 mg/dL — ABNORMAL HIGH (ref 70–99)
Glucose-Capillary: 92 mg/dL (ref 70–99)

## 2024-01-18 LAB — MAGNESIUM: Magnesium: 2.2 mg/dL (ref 1.7–2.4)

## 2024-01-18 MED ORDER — FLUTICASONE PROPIONATE 50 MCG/ACT NA SUSP
1.0000 | Freq: Every day | NASAL | Status: DC
  Administered 2024-01-18 – 2024-01-19 (×2): 1 via NASAL
  Filled 2024-01-18: qty 16

## 2024-01-18 NOTE — Evaluation (Signed)
 Physical Therapy Evaluation Patient Details Name: ANALLELY ROSELL MRN: [id number] DOB: 02-13-1971 Today's Date: 01/18/2024  History of Present Illness  Chairty F Mellin is a 53 y.o. female with medical history significant of hypertension, insulin -dependent type 2 diabetes presents to the emergency department for evaluation of worsening shortness of breath for the past few days.  She denies associated cough, wheezing, orthopnea, chest pain, palpitations, fevers, or change in her chronic lower extremity swelling.  She has no known cardiac or pulmonary history and does not smoke.  She does report running out of her water  pill last week.        On EMS arrival, she was reportedly saturating 77% on room air and was placed on 6 L nasal cannula.  In the emergency department, she was afebrile, nontachycardic, hypertensive, saturating appropriately on 6 L nasal cannula.  White blood cell count 11.2. Troponin negative, BNP 271.  COVID flu and RSV negative.  2 view chest x-ray suggestive of bilateral pulmonary edema with a small left pleural effusion.  She was given a dose of Lasix and hospitalist was called for admission for further management.  Clinical Impression  Patient noted to be in sitting position at PT arrival in room, for an initial PT evaluation due to a decline in functional status, with baseline mobility reported as independent, and currently requiring modI with need for supplemental oxygen at rest and for hallway ambulation 200' feet. The patient is A&O x 4, presenting with good willingness to work with PT and goals of going home. The patient resides in a apartment and lives children and spouse with family/friend support. There are no STE inside the residence but she has 14 steps to get to second level of the apartment.  Vitals are unstable with an SpO? of 80% on RA at rest; requires 2 L/min supplemental  oxygen for hallway ambulation with additional vc needed for breathing. The overall clinical impression  is that the patient presents with moderate mobility limitations secondary to de-stating O2 levels on room air . Recommended skilled PT will address safety, mobility, and discharge planning. PT recommendation to d/c patient to home upon medical clearance with supplemental oxygen.        If plan is discharge home, recommend the following: A little help with walking and/or transfers   Can travel by private vehicle        Equipment Recommendations Other (comment) (supplemental oxygen)  Recommendations for Other Services       Functional Status Assessment Patient has had a recent decline in their functional status and demonstrates the ability to make significant improvements in function in a reasonable and predictable amount of time.     Precautions / Restrictions Precautions Precaution/Restrictions Comments: needs supplemental oxygen      Mobility  Bed Mobility Overal bed mobility: Modified Independent, Independent                  Transfers Overall transfer level: Independent                      Ambulation/Gait Ambulation/Gait assistance: Modified independent (Device/Increase time), Supervision Gait Distance (Feet): 200 Feet Assistive device: None Gait Pattern/deviations: WFL(Within Functional Limits)       General Gait Details: needs vc for recovery; education needed for proper breathing mechanics; destats on RA at rest to spO2 80% does not improve with education for proper breahting mechanics; 2 L/min. needed for ambulation  Stairs  Wheelchair Mobility     Tilt Bed    Modified Rankin (Stroke Patients Only)       Balance Overall balance assessment: Modified Independent                                           Pertinent Vitals/Pain Pain Assessment Pain Assessment: No/denies pain    Home Living Family/patient expects to be discharged to:: Private residence Living Arrangements: Spouse/significant  other;Children Available Help at Discharge: Family;Friend(s);Available 24 hours/day Type of Home: Apartment Home Access: Stairs to enter     Alternate Level Stairs-Number of Steps: 14 Home Layout: Two level Home Equipment: None      Prior Function Prior Level of Function : Independent/Modified Independent             Mobility Comments: winded no use of supplemental oxygen ADLs Comments: independent     Extremity/Trunk Assessment   Upper Extremity Assessment Upper Extremity Assessment: Overall WFL for tasks assessed    Lower Extremity Assessment Lower Extremity Assessment: Overall WFL for tasks assessed;Generalized weakness    Cervical / Trunk Assessment Cervical / Trunk Assessment: Normal  Communication   Communication Communication: No apparent difficulties    Cognition Arousal: Alert Behavior During Therapy: WFL for tasks assessed/performed   PT - Cognitive impairments: No apparent impairments                         Following commands: Intact       Cueing Cueing Techniques: Verbal cues     General Comments      Exercises     Assessment/Plan    PT Assessment Patient needs continued PT services  PT Problem List Decreased activity tolerance;Decreased mobility       PT Treatment Interventions Gait training;Stair training;Functional mobility training;Therapeutic activities;Patient/family education    PT Goals (Current goals can be found in the Care Plan section)  Acute Rehab PT Goals Patient Stated Goal: Pt wants to go back home PT Goal Formulation: With patient Time For Goal Achievement: 02/08/24 Potential to Achieve Goals: Good    Frequency Min 1X/week     Co-evaluation               AM-PAC PT 6 Clicks Mobility  Outcome Measure Help needed turning from your back to your side while in a flat bed without using bedrails?: None Help needed moving from lying on your back to sitting on the side of a flat bed without using  bedrails?: None Help needed moving to and from a bed to a chair (including a wheelchair)?: None Help needed standing up from a chair using your arms (e.g., wheelchair or bedside chair)?: None Help needed to walk in hospital room?: None Help needed climbing 3-5 steps with a railing? : A Little 6 Click Score: 23    End of Session Equipment Utilized During Treatment: Oxygen (2L/min. needed for hallway ambulation) Activity Tolerance: Patient limited by fatigue Patient left: in chair;with call bell/phone within reach Nurse Communication: Mobility status PT Visit Diagnosis: Difficulty in walking, not elsewhere classified (R26.2)    Time: 8867-8840 PT Time Calculation (min) (ACUTE ONLY): 27 min   Charges:   PT Evaluation $PT Eval Low Complexity: 1 Low   PT General Charges $$ ACUTE PT VISIT: 1 Visit         Sherlean Lesches DPT, PT    Sherlean  A Myleigh Amara 01/18/2024, 12:18 PM

## 2024-01-18 NOTE — Plan of Care (Signed)
  Problem: Education: Goal: Ability to describe self-care measures that may prevent or decrease complications (Diabetes Survival Skills Education) will improve Outcome: Progressing   Problem: Coping: Goal: Ability to adjust to condition or change in health will improve Outcome: Progressing   Problem: Fluid Volume: Goal: Ability to maintain a balanced intake and output will improve Outcome: Progressing   Problem: Metabolic: Goal: Ability to maintain appropriate glucose levels will improve Outcome: Progressing   Problem: Nutritional: Goal: Maintenance of adequate nutrition will improve Outcome: Progressing   Problem: Skin Integrity: Goal: Risk for impaired skin integrity will decrease Outcome: Progressing   Problem: Tissue Perfusion: Goal: Adequacy of tissue perfusion will improve Outcome: Progressing   Problem: Clinical Measurements: Goal: Ability to maintain clinical measurements within normal limits will improve Outcome: Progressing Goal: Will remain free from infection Outcome: Progressing Goal: Diagnostic test results will improve Outcome: Progressing Goal: Respiratory complications will improve Outcome: Progressing Goal: Cardiovascular complication will be avoided Outcome: Progressing   Problem: Activity: Goal: Risk for activity intolerance will decrease Outcome: Progressing   Problem: Nutrition: Goal: Adequate nutrition will be maintained Outcome: Progressing

## 2024-01-18 NOTE — Progress Notes (Addendum)
 Progress Note   Patient: Kathryn Hogan FMW:[id number] DOB: 1970/03/19 DOA: 01/16/2024     2 DOS: the patient was seen and examined on 01/18/2024   Brief hospital course:  Kathryn Hogan is a 53 y.o. female with medical history significant of hypertension, insulin -dependent type 2 diabetes presents to the emergency department for evaluation of worsening shortness of breath for the past few days.  She denies associated cough, wheezing, orthopnea, chest pain, palpitations, fevers, or change in her chronic lower extremity swelling. She has no known cardiac or pulmonary history and does not smoke. She does report running out of her water  pill last week.     On EMS arrival, she was reportedly saturating 77% on room air and was placed on 6 L nasal cannula.  In the emergency department, she was afebrile, nontachycardic, hypertensive, saturating appropriately on 6 L nasal cannula.  White blood cell count 11.2. Troponin negative, BNP 271.  COVID flu and RSV negative.  2 view chest x-ray suggestive of bilateral pulmonary edema with a small left pleural effusion.  She was given a dose of Lasix and hospitalist was called for admission for further management.    Assessment and Plan:  Acute hypoxic respiratory failure Acute pulmonary edema  Hypertensive heart disease Per patient no documented history of congestive heart failure but is on torsemide at home and symptoms developed after she ran out of her diuretic therapy Initially required 6 L of oxygen to maintain pulse oximetry greater than 92%, currently down to 2 L.  Will continue to wean off O2 as tolerated Attempts were made to wean patient off 2 L and plan for PT she was 89% on 1 L and dropped to 80% with exertion requiring 2 L of oxygen to bring her pulse oximetry back to 92%.   She will be assessed for home O2 prior to discharge Continue Bumex 1 mg twice daily Continue benazepril , metoprolol  and amlodipine  Follow-up results of 2D  echocardiogram to assess LVEF         HTN  Improved blood pressure control Continue benazepril , metoprolol  and amlodipine  Uptitrate to optimize blood pressure control     T2DM  Hold metformin  Continue Lantus  and sliding scale insulin  Maintain consistent carbohydrate diet Blood sugars are stable     Morbid obesity BMI 57.63 kg/m2 Complicates overall prognosis and plan Lifestyle modification and exercise has been discussed with patient in detail       Depression Continue Zoloft      Subjective: Feels better  Physical Exam: Vitals:   01/18/24 0500 01/18/24 0741 01/18/24 1128 01/18/24 1129  BP:  (!) 134/54    Pulse:  84    Resp:  15    Temp:  97.8 F (36.6 C)    TempSrc:      SpO2:  97% 97% 95%  Weight: (!) 148.2 kg     Height:       Vitals and nursing note reviewed.  Constitutional:      General: She is not in acute distress.    Appearance: She is obese. She is not ill-appearing or toxic-appearing.  HENT:     Head: Normocephalic and atraumatic.  Eyes:     Extraocular Movements: Extraocular movements intact.     Pupils: Pupils are equal, round, and reactive to light.  Cardiovascular:     Rate and Rhythm: Normal rate and regular rhythm.  Pulmonary:     Effort: Pulmonary effort is normal. No tachypnea or accessory muscle usage.  Comments: Diminished breath sounds b/l  Abdominal:     Palpations: Abdomen is soft.  Musculoskeletal:     Cervical back: Normal range of motion.     Right lower leg: Edema present. (1+)     Left lower leg: Edema present. (1+ Skin:    General: Skin is warm and dry.  Neurological:     General: No focal deficit present.     Mental Status: She is alert and oriented to person, place, and time.     Cranial Nerves: No cranial nerve deficit.  Psychiatric:        Mood and Affect: Mood normal.        Behavior: Behavior normal.      Data Reviewed: BUN 28, creatinine 0.98, potassium 4.3, magnesium  2.2 Labs reviewed  Family  Communication: Plan of care was discussed with patient in detail.  She verbalizes understanding and agrees with the plan  Disposition: Status is: Inpatient Remains inpatient appropriate because: For possible discharge in a.m.  Planned Discharge Destination: Home    Time spent: 40 minutes  Author: Aimee Somerset, MD 01/18/2024 12:14 PM  For on call review [email or web address].

## 2024-01-19 DIAGNOSIS — J810 Acute pulmonary edema: Secondary | ICD-10-CM | POA: Diagnosis not present

## 2024-01-19 DIAGNOSIS — Z794 Long term (current) use of insulin: Secondary | ICD-10-CM

## 2024-01-19 DIAGNOSIS — E1165 Type 2 diabetes mellitus with hyperglycemia: Secondary | ICD-10-CM | POA: Diagnosis not present

## 2024-01-19 DIAGNOSIS — I5031 Acute diastolic (congestive) heart failure: Secondary | ICD-10-CM

## 2024-01-19 DIAGNOSIS — J9601 Acute respiratory failure with hypoxia: Secondary | ICD-10-CM | POA: Diagnosis not present

## 2024-01-19 LAB — BASIC METABOLIC PANEL WITH GFR
Anion gap: 9 (ref 5–15)
BUN: 26 mg/dL — ABNORMAL HIGH (ref 6–20)
CO2: 28 mmol/L (ref 22–32)
Calcium: 9 mg/dL (ref 8.9–10.3)
Chloride: 107 mmol/L (ref 98–111)
Creatinine, Ser: 0.86 mg/dL (ref 0.44–1.00)
GFR, Estimated: >60 mL/min (ref >60)
Glucose, Bld: 99 mg/dL (ref 70–99)
Potassium: 4.1 mmol/L (ref 3.5–5.1)
Sodium: 144 mmol/L (ref 135–145)

## 2024-01-19 LAB — MAGNESIUM: Magnesium: 2.1 mg/dL (ref 1.7–2.4)

## 2024-01-19 LAB — GLUCOSE, CAPILLARY: Glucose-Capillary: 85 mg/dL (ref 70–99)

## 2024-01-19 MED ORDER — TORSEMIDE 20 MG PO TABS: 20.0000 mg | ORAL_TABLET | Freq: Every day | ORAL | 1 refills | Status: AC

## 2024-01-19 NOTE — Discharge Instructions (Addendum)
 Keep log of sugars and BP at home and d/w PCP Use your oxygen as instructed.  Discuss with your PCP about getting formal sleep study as out pt

## 2024-01-19 NOTE — Discharge Summary (Signed)
 Physician Discharge Summary   Patient: Kathryn Hogan MRN: [id number] DOB: 1970-12-18  Admit date:     01/16/2024  Discharge date: 01/19/24  Discharge Physician: Leita Blanch   PCP: [facility]   Recommendations at discharge:    F/u PCP in 1-2 weeks  Discharge Diagnoses: Principal Problem:   Acute hypoxic respiratory failure (HCC) Active Problems:   HTN (hypertension)  Kathryn Hogan is a 53 y.o. female with medical history significant of hypertension, insulin -dependent type 2 diabetes presents to the emergency department for evaluation of worsening shortness of breath for the past few days.  She denies associated cough, wheezing, orthopnea, chest pain, palpitations, fevers, or change in her chronic lower extremity swelling. She has no known cardiac or pulmonary history and does not smoke. She does report running out of her water  pill last week.   chest x-ray suggestive of bilateral pulmonary edema with a small left pleural effusion.    Acute hypoxic respiratory failure suspected due ot CHF acute diastolic Acute pulmonary edema  Hypertensive heart disease --Per patient no documented history of congestive heart failure but is on torsemide at home and symptoms developed after she ran out of her diuretic therapy --Initially required 6 L of oxygen to maintain pulse oximetry greater than 92%, currently down to 2 L.   --Will continue to wean off O2 as tolerated--pt desats while walking to upper 80's. Will set up home oxygen --Defer to PCP to try wean her off as out pt if able to --Continue Bumex 1 mg IV twice daily--change to torsemide at home daily for now. No leg edema --Continue benazepril , metoprolol  and amlodipine  --2D echocardiogram EF 55-60%   HTN  --Improved blood pressure control --Continue benazepril , metoprolol  and amlodipine  --Uptitrate to optimize blood pressure control   T2DM  --Maintain consistent carbohydrate diet --Blood sugars are  stable --A1c as out pt 7.0%.  --resume home meds including insulin    Morbid obesity with suspected  OSA BMI 57.63 kg/m2 Complicates overall prognosis and plan Lifestyle modification and exercise has been discussed with patient in detail --pt advised to get formal sleep study as out pt and cont use of oxygen at night as well    Depression --Continue Zoloft   overall pt improving. D/c home. Pt agreeable   Disposition: Home Diet recommendation:  Discharge Diet Orders (From admission, onward)     Start     Ordered   01/19/24 0000  Diet - low sodium heart healthy        01/19/24 1037   01/19/24 0000  Diet Carb Modified        01/19/24 1037           Cardiac and Carb modified diet DISCHARGE MEDICATION: Allergies as of 01/19/2024   No Known Allergies      Medication List     TAKE these medications    amLODipine  10 MG tablet Commonly known as: NORVASC  Take 10 mg by mouth every evening.   benazepril  40 MG tablet Commonly known as: LOTENSIN  Take 1 tablet (40 mg total) by mouth daily. What changed: when to take this   insulin  glargine 100 unit/mL Sopn Commonly known as: LANTUS  Inject 50 Units into the skin in the morning.   Insulin  Pen Needle 31G X 5 MM Misc 1 Dose by Does not apply route 3 (three) times daily before meals.   Jardiance 25 MG Tabs tablet Generic drug: empagliflozin Take 25 mg by mouth every evening.   metFORMIN  500 MG 24 hr  tablet Commonly known as: GLUCOPHAGE -XR Take 500 mg by mouth in the morning and at bedtime.   metoprolol  200 MG 24 hr tablet Commonly known as: TOPROL -XL Take 200 mg by mouth every evening. Take with or immediately following a meal.   naproxen 500 MG tablet Commonly known as: NAPROSYN Take 500 mg by mouth 2 (two) times daily as needed (pain.).   sertraline 100 MG tablet Commonly known as: ZOLOFT Take 100 mg by mouth every evening.   torsemide 20 MG tablet Commonly known as: DEMADEX Take 1 tablet (20 mg total) by  mouth daily. What changed: when to take this   Trulicity 1.5 MG/0.5ML Soaj Generic drug: Dulaglutide Inject 1.5 mg into the skin once a week. What changed: Another medication with the same name was removed. Continue taking this medication, and follow the directions you see here.               Durable Medical Equipment  (From admission, onward)           Start     Ordered   01/19/24 0937  For home use only DME oxygen  Once       Question Answer Comment  Length of Need 6 Months   Mode or (Route) Nasal cannula   Liters per Minute 2   Frequency Continuous (stationary and portable oxygen unit needed)   Oxygen conserving device Yes   Oxygen delivery system: Gas      01/19/24 0936            Follow-up Information     [facility]. Schedule an appointment as soon as possible for a visit in 1 week(s).   Specialty: General Practice Contact information: [address] [place] [address] [phone]                Discharge Exam: Fredricka Weights   01/16/24 1231 01/18/24 0500 01/19/24 0500  Weight: (!) 138.3 kg (!) 148.2 kg (!) 147.8 kg   Morbid obesity Resp decreased BS bases LE   puffy legs trace edema CVS s1s2 normal   Condition at discharge: fair  The results of significant diagnostics from this hospitalization (including imaging, microbiology, ancillary and laboratory) are listed below for reference.   Imaging Studies: ECHOCARDIOGRAM COMPLETE Result Date: 01/17/2024    ECHOCARDIOGRAM REPORT   Patient Name:   Kathryn Hogan Date of Exam: 01/17/2024 Medical Rec #:  [id number]       Height:       61.0 in Accession #:    [id number]      Weight:       305.0 lb Date of Birth:  09-01-1970       BSA:          2.260 m Patient Age:    53 years        BP:           167/97 mmHg Patient Gender: F               HR:           82 bpm. Exam Location:  [facility] Procedure: 2D Echo, Color Doppler, Cardiac Doppler and Intracardiac             Opacification Agent (Both Spectral and Color Flow Doppler were            utilized during procedure). Indications:     Dyspnea  History:         Patient has no prior history  of Echocardiogram examinations.                  Signs/Symptoms:Dyspnea; Risk Factors:Hypertension, Diabetes and                  Sleep Apnea.  Sonographer:     Logan Shove RDCS Referring Phys:  [id number] DAVED BROCKS [facility] Diagnosing Phys: Redell Cave MD IMPRESSIONS  1. Left ventricular ejection fraction, by estimation, is 60 to 65%. Left ventricular ejection fraction by 2D MOD biplane is 65.2 %. Left ventricular ejection fraction by PLAX is 65 %. The left ventricle has normal function. The left ventricle has no regional wall motion abnormalities. There is mild left ventricular hypertrophy. Left ventricular diastolic parameters were normal.  2. Right ventricular systolic function is normal. The right ventricular size is moderately enlarged.  3. Left atrial size was mildly dilated.  4. The mitral valve is normal in structure. Mild mitral valve regurgitation.  5. The aortic valve was not well visualized. Aortic valve regurgitation is not visualized. Aortic valve sclerosis is present, with no evidence of aortic valve stenosis.  6. The inferior vena cava is normal in size with <50% respiratory variability, suggesting right atrial pressure of 8 mmHg. FINDINGS  Left Ventricle: Left ventricular ejection fraction, by estimation, is 60 to 65%. Left ventricular ejection fraction by PLAX is 65 %. Left ventricular ejection fraction by 2D MOD biplane is 65.2 %. The left ventricle has normal function. The left ventricle has no regional wall motion abnormalities. The left ventricular internal cavity size was normal in size. There is mild left ventricular hypertrophy. Left ventricular diastolic parameters were normal. Right Ventricle: The right ventricular size is moderately enlarged. No increase in right ventricular wall thickness. Right ventricular systolic  function is normal. Left Atrium: Left atrial size was mildly dilated. Right Atrium: Right atrial size was not well visualized. Pericardium: There is no evidence of pericardial effusion. Mitral Valve: The mitral valve is normal in structure. Mild mitral valve regurgitation. Tricuspid Valve: The tricuspid valve is normal in structure. Tricuspid valve regurgitation is not demonstrated. Aortic Valve: The aortic valve was not well visualized. Aortic valve regurgitation is not visualized. Aortic valve sclerosis is present, with no evidence of aortic valve stenosis. Aortic valve peak gradient measures 8.3 mmHg. Pulmonic Valve: The pulmonic valve was normal in structure. Pulmonic valve regurgitation is trivial. Aorta: The aortic root is normal in size and structure. Venous: The inferior vena cava is normal in size with less than 50% respiratory variability, suggesting right atrial pressure of 8 mmHg. IAS/Shunts: No atrial level shunt detected by color flow Doppler.  LEFT VENTRICLE PLAX 2D                        Biplane EF (MOD) LV EF:         Left            LV Biplane EF:   Left                ventricular                      ventricular                ejection                         ejection                fraction  by                      fraction by                PLAX is 65                       2D MOD                %.                               biplane is LVIDd:         4.80 cm                          65.2 %. LVIDs:         3.10 cm LV PW:         1.30 cm         Diastology LV IVS:        1.10 cm         LV e' medial:    7.72 cm/s LVOT diam:     2.00 cm         LV E/e' medial:  12.8 LVOT Area:     3.14 cm        LV e' lateral:   8.02 cm/s                                LV E/e' lateral: 12.4  LV Volumes (MOD) LV vol d, MOD    153.0 ml A2C: LV vol d, MOD    136.0 ml A4C: LV vol s, MOD    55.9 ml A2C: LV vol s, MOD    44.6 ml A4C: LV SV MOD A2C:   97.1 ml LV SV MOD A4C:   136.0 ml LV SV MOD BP:    95.0 ml RIGHT  VENTRICLE             IVC RV Basal diam:  4.50 cm     IVC diam: 1.90 cm RV Mid diam:    3.60 cm RV S prime:     17.30 cm/s TAPSE (M-mode): 3.2 cm LEFT ATRIUM           Index        RIGHT ATRIUM           Index LA diam:      4.10 cm 1.81 cm/m   RA Area:     24.60 cm LA Vol (A2C): 87.9 ml 38.90 ml/m  RA Volume:   83.20 ml  36.82 ml/m LA Vol (A4C): 56.6 ml 25.05 ml/m  AORTIC VALVE AV Area (Vmax): 2.47 cm AV Vmax:        144.00 cm/s AV Peak Grad:   8.3 mmHg LVOT Vmax:      113.00 cm/s  AORTA Ao Root diam: 3.05 cm Ao Asc diam:  3.20 cm MITRAL VALVE MV Area (PHT): 3.89 cm    SHUNTS MV Decel Time: 195 msec    Systemic Diam: 2.00 cm MV E velocity: 99.20 cm/s MV A velocity: 97.90 cm/s MV E/A ratio:  1.01 Redell Cave MD Electronically signed by Redell Cave MD Signature Date/Time: 01/17/2024/5:25:34 PM    Final    DG Chest 2 View Result Date: 01/16/2024  CLINICAL DATA:  Short of breath for 1 week. EXAM: CHEST - 2 VIEW COMPARISON:  11/06/2019. FINDINGS: Cardiac silhouette normal in size. No convincing mediastinal or hilar masses or adenopathy. Lungs demonstrate vascular congestion, interstitial prominence with patchy airspace opacities most evident in the bilateral upper lobes. Left lung base opacities consistent with a small effusion. No convincing right pleural effusion. No pneumothorax. Skeletal structures are grossly intact. IMPRESSION: 1. Bilateral airspace lung opacities with overall findings suggesting pulmonary edema. Small left pleural effusion. Electronically Signed   By: Alm Parkins M.D.   On: 01/16/2024 13:45    Microbiology: Results for orders placed or performed during the hospital encounter of 01/16/24  Blood culture (routine x 2)     Status: None (Preliminary result)   Collection Time: 01/16/24 12:53 PM   Specimen: BLOOD  Result Value Ref Range Status   Specimen Description BLOOD RIGHT ANTECUBITAL  Final   Special Requests   Final    BOTTLES DRAWN AEROBIC AND ANAEROBIC Blood  Culture results may not be optimal due to an inadequate volume of blood received in culture bottles   Culture   Final    NO GROWTH 3 DAYS Performed at [facility], [address], [place], [place] [address]    Report Status PENDING  Incomplete  Resp panel by RT-PCR (RSV, Flu A&B, Covid) Anterior Nasal Swab     Status: None   Collection Time: 01/16/24  1:16 PM   Specimen: Anterior Nasal Swab  Result Value Ref Range Status   SARS Coronavirus 2 by RT PCR NEGATIVE NEGATIVE Final    Comment: (NOTE) SARS-CoV-2 target nucleic acids are NOT DETECTED.  The SARS-CoV-2 RNA is generally detectable in upper respiratory specimens during the acute phase of infection. The lowest concentration of SARS-CoV-2 viral copies this assay can detect is 138 copies/mL. A negative result does not preclude SARS-Cov-2 infection and should not be used as the sole basis for treatment or other patient management decisions. A negative result may occur with  improper specimen collection/handling, submission of specimen other than nasopharyngeal swab, presence of viral mutation(s) within the areas targeted by this assay, and inadequate number of viral copies(<138 copies/mL). A negative result must be combined with clinical observations, patient history, and epidemiological information. The expected result is Negative.  Fact Sheet for Patients:  bloggercourse.com  Fact Sheet for Healthcare Providers:  seriousbroker.it  This test is no t yet approved or cleared by the [place]  FDA and  has been authorized for detection and/or diagnosis of SARS-CoV-2 by FDA under an Emergency Use Authorization (EUA). This EUA will remain  in effect (meaning this test can be used) for the duration of the COVID-19 declaration under Section 564(b)(1) of the Act, 21 U.S.C.section 360bbb-3(b)(1), unless the authorization is terminated  or revoked sooner.       Influenza  A by PCR NEGATIVE NEGATIVE Final   Influenza B by PCR NEGATIVE NEGATIVE Final    Comment: (NOTE) The Xpert Xpress SARS-CoV-2/FLU/RSV plus assay is intended as an aid in the diagnosis of influenza from Nasopharyngeal swab specimens and should not be used as a sole basis for treatment. Nasal washings and aspirates are unacceptable for Xpert Xpress SARS-CoV-2/FLU/RSV testing.  Fact Sheet for Patients: bloggercourse.com  Fact Sheet for Healthcare Providers: seriousbroker.it  This test is not yet approved or cleared by the [place]  FDA and has been authorized for detection and/or diagnosis of SARS-CoV-2 by FDA under an Emergency Use Authorization (EUA). This EUA will remain in effect (  meaning this test can be used) for the duration of the COVID-19 declaration under Section 564(b)(1) of the Act, 21 U.S.C. section 360bbb-3(b)(1), unless the authorization is terminated or revoked.     Resp Syncytial Virus by PCR NEGATIVE NEGATIVE Final    Comment: (NOTE) Fact Sheet for Patients: bloggercourse.com  Fact Sheet for Healthcare Providers: seriousbroker.it  This test is not yet approved or cleared by the [place]  FDA and has been authorized for detection and/or diagnosis of SARS-CoV-2 by FDA under an Emergency Use Authorization (EUA). This EUA will remain in effect (meaning this test can be used) for the duration of the COVID-19 declaration under Section 564(b)(1) of the Act, 21 U.S.C. section 360bbb-3(b)(1), unless the authorization is terminated or revoked.  Performed at [facility], [address] [place], [place] [address]     Labs: CBC: Recent Labs  Lab 01/16/24 1313 01/17/24 0636  WBC 11.2* 8.0  HGB 13.3 10.7*  HCT 43.8 34.5*  MCV 85.9 85.2  PLT 263 246   Basic Metabolic Panel: Recent Labs  Lab 01/16/24 1340 01/17/24 0636 01/18/24 0314  01/19/24 0444  NA 143 144 144 144  K 4.3 4.3 4.3 4.1  CL 106 108 108 107  CO2 26 28 29 28   GLUCOSE 193* 150* 109* 99  BUN 21* 25* 28* 26*  CREATININE 1.04* 0.99 0.98 0.86  CALCIUM 9.0 8.6* 8.7* 9.0  MG  --  2.4 2.2 2.1   Liver Function Tests: Recent Labs  Lab 01/16/24 1340  AST 12*  ALT 14  ALKPHOS 120  BILITOT 0.5  PROT 7.3  ALBUMIN 3.8   CBG: Recent Labs  Lab 01/18/24 0743 01/18/24 1106 01/18/24 1643 01/18/24 2040 01/19/24 0751  GLUCAP 88 121* 159* 92 85    Discharge time spent: greater than 30 minutes.  Signed: Leita Blanch, MD [facility] 01/19/2024

## 2024-01-19 NOTE — Progress Notes (Signed)
 Physical Therapy Treatment Patient Details Name: Kathryn Hogan MRN: [id number] DOB: 1970-11-17 Today's Date: 01/19/2024   History of Present Illness Kathryn Hogan is a 53 y.o. female with medical history significant of hypertension, insulin -dependent type 2 diabetes presents to the emergency department for evaluation of worsening shortness of breath for the past few days.  She denies associated cough, wheezing, orthopnea, chest pain, palpitations, fevers, or change in her chronic lower extremity swelling.  She has no known cardiac or pulmonary history and does not smoke.  She does report running out of her water  pill last week.        On EMS arrival, she was reportedly saturating 77% on room air and was placed on 6 L nasal cannula.  In the emergency department, she was afebrile, nontachycardic, hypertensive, saturating appropriately on 6 L nasal cannula.  White blood cell count 11.2. Troponin negative, BNP 271.  COVID flu and RSV negative.  2 view chest x-ray suggestive of bilateral pulmonary edema with a small left pleural effusion.  She was given a dose of Lasix and hospitalist was called for admission for further management.    PT Comments  Patient seen for PT session focused on functional mobility. Patient required no assistance. Tolerated session well with no signs of exertion or distress. Vitals remained stable during activity on 2 L/min O2. Patient shows good potential to make progress with continued acute level rehab. Pt making good progress toward goals, will continue to follow POC. Discharge recommendation OPPT at next venue of care.    If plan is discharge home, recommend the following: A little help with walking and/or transfers   Can travel by private vehicle        Equipment Recommendations  Other (comment) (supplemental oxygen)    Recommendations for Other Services       Precautions / Restrictions Precautions Precaution/Restrictions Comments: needs supplemental  oxygen Restrictions Weight Bearing Restrictions Per Provider Order: No     Mobility  Bed Mobility Overal bed mobility: Modified Independent, Independent                  Transfers Overall transfer level: Independent                      Ambulation/Gait Ambulation/Gait assistance: Modified independent (Device/Increase time), Supervision Gait Distance (Feet): 50 Feet Assistive device: None Gait Pattern/deviations: WFL(Within Functional Limits)       General Gait Details: ambulated in room on 2 L/min. no symptoms throughout   Stairs             Wheelchair Mobility     Tilt Bed    Modified Rankin (Stroke Patients Only)       Balance Overall balance assessment: Modified Independent                                          Communication Communication Communication: No apparent difficulties  Cognition Arousal: Alert Behavior During Therapy: WFL for tasks assessed/performed   PT - Cognitive impairments: No apparent impairments                         Following commands: Intact      Cueing Cueing Techniques: Verbal cues  Exercises      General Comments        Pertinent Vitals/Pain Pain Assessment Pain Assessment: No/denies pain  Home Living                          Prior Function            PT Goals (current goals can now be found in the care plan section) Acute Rehab PT Goals Patient Stated Goal: Pt wants to go back home PT Goal Formulation: With patient Time For Goal Achievement: 02/08/24 Potential to Achieve Goals: Good Progress towards PT goals: Progressing toward goals    Frequency    Min 1X/week      PT Plan      Co-evaluation              AM-PAC PT 6 Clicks Mobility   Outcome Measure  Help needed turning from your back to your side while in a flat bed without using bedrails?: None Help needed moving from lying on your back to sitting on the side of a flat  bed without using bedrails?: None Help needed moving to and from a bed to a chair (including a wheelchair)?: None Help needed standing up from a chair using your arms (e.g., wheelchair or bedside chair)?: None Help needed to walk in hospital room?: None Help needed climbing 3-5 steps with a railing? : A Little 6 Click Score: 23    End of Session Equipment Utilized During Treatment: Oxygen Activity Tolerance: Patient tolerated treatment well Patient left: Other (comment) (transport staff in room to discharge) Nurse Communication: Mobility status PT Visit Diagnosis: Difficulty in walking, not elsewhere classified (R26.2)     Time: 8873-8864 PT Time Calculation (min) (ACUTE ONLY): 9 min  Charges:    $Therapeutic Activity: 8-22 mins PT General Charges $$ ACUTE PT VISIT: 1 Visit                     Sherlean Lesches DPT, PT     Sherlean A Kinzi Frediani 01/19/2024, 11:44 AM

## 2024-01-19 NOTE — Progress Notes (Signed)
 SATURATION QUALIFICATIONS: (This note is used to comply with regulatory documentation for home oxygen)  Patient Saturations on Room Air at Rest = 92%  Patient Saturations on Room Air while Ambulating = 83%  Patient Saturations on 3 Liters of oxygen while Ambulating = 92%  Please briefly explain why patient needs home oxygen:

## 2024-01-19 NOTE — Plan of Care (Signed)

## 2024-01-21 LAB — CULTURE, BLOOD (ROUTINE X 2): Culture: NO GROWTH
